# Patient Record
Sex: Male | Born: 1960
Health system: Southern US, Community
[De-identification: ages and names within clinical notes are randomized; demographics above are authoritative.]

## PROBLEM LIST (undated history)

## (undated) HISTORY — PX: FOOT SURGERY: SHX648

---

## 2003-08-23 ENCOUNTER — Inpatient Hospital Stay (HOSPITAL_COMMUNITY): Admission: RE | Admit: 2003-08-23 | Discharge: 2003-08-28 | Payer: Self-pay | Admitting: Psychiatry

## 2004-11-30 ENCOUNTER — Ambulatory Visit (HOSPITAL_COMMUNITY): Admission: RE | Admit: 2004-11-30 | Discharge: 2004-11-30 | Payer: Self-pay | Admitting: Family Medicine

## 2004-12-20 ENCOUNTER — Emergency Department (HOSPITAL_COMMUNITY): Admission: EM | Admit: 2004-12-20 | Discharge: 2004-12-20 | Payer: Self-pay | Admitting: Emergency Medicine

## 2004-12-24 ENCOUNTER — Ambulatory Visit (HOSPITAL_COMMUNITY): Admission: RE | Admit: 2004-12-24 | Discharge: 2004-12-24 | Payer: Self-pay | Admitting: Neurology

## 2004-12-27 ENCOUNTER — Encounter: Admission: RE | Admit: 2004-12-27 | Discharge: 2004-12-27 | Payer: Self-pay | Admitting: Neurology

## 2005-01-24 ENCOUNTER — Encounter: Admission: RE | Admit: 2005-01-24 | Discharge: 2005-01-24 | Payer: Self-pay | Admitting: Orthopedic Surgery

## 2005-07-17 ENCOUNTER — Emergency Department (HOSPITAL_COMMUNITY): Admission: EM | Admit: 2005-07-17 | Discharge: 2005-07-17 | Payer: Self-pay | Admitting: Emergency Medicine

## 2006-06-26 IMAGING — CR DG THORACIC SPINE 3V
3 series · 3 of 3 positions shown · non-contrast
Comparison: none

CLINICAL DATA: Pain in the mid back.
BONE SCAN:
The patient was injected with 23.9 mCi of Lechnetium-VVm MDP intravenously, and imaging of the thoracolumbar spine was performed.  On the early soft tissue phase, no abnormality is seen.  On delayed images, minimal activity is noted in the region of T5 and T6 vertebral bodies.  The remainder of activity throughout the thoracic and lumbar spine appear normal.  Plain films of the thoracic spine will be obtained.

[view not recorded (1 of 3)]
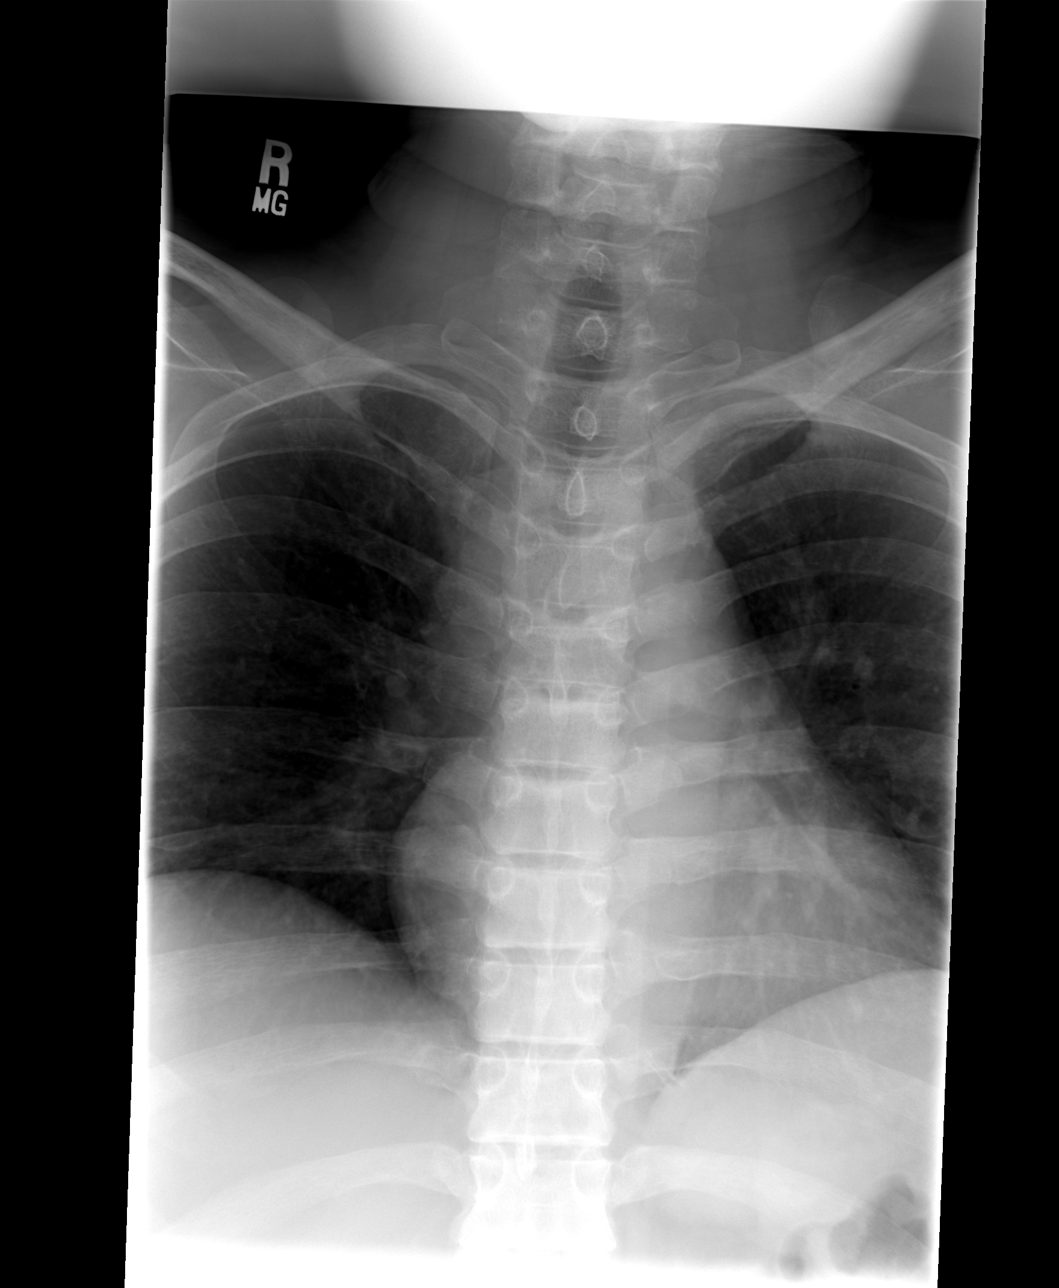

[view not recorded (2 of 3)]
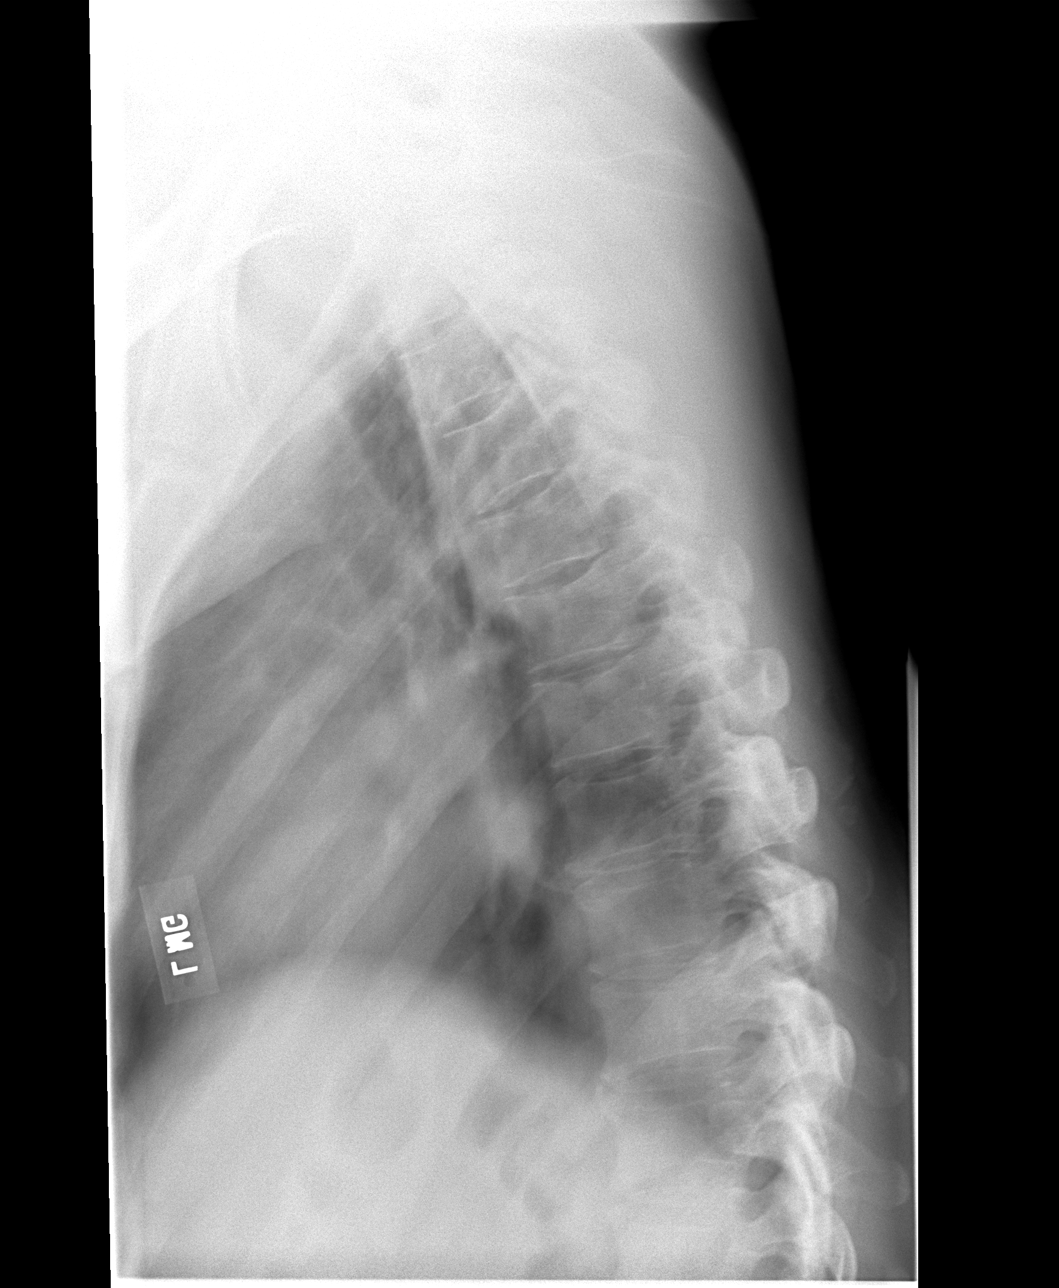

[view not recorded (3 of 3)]
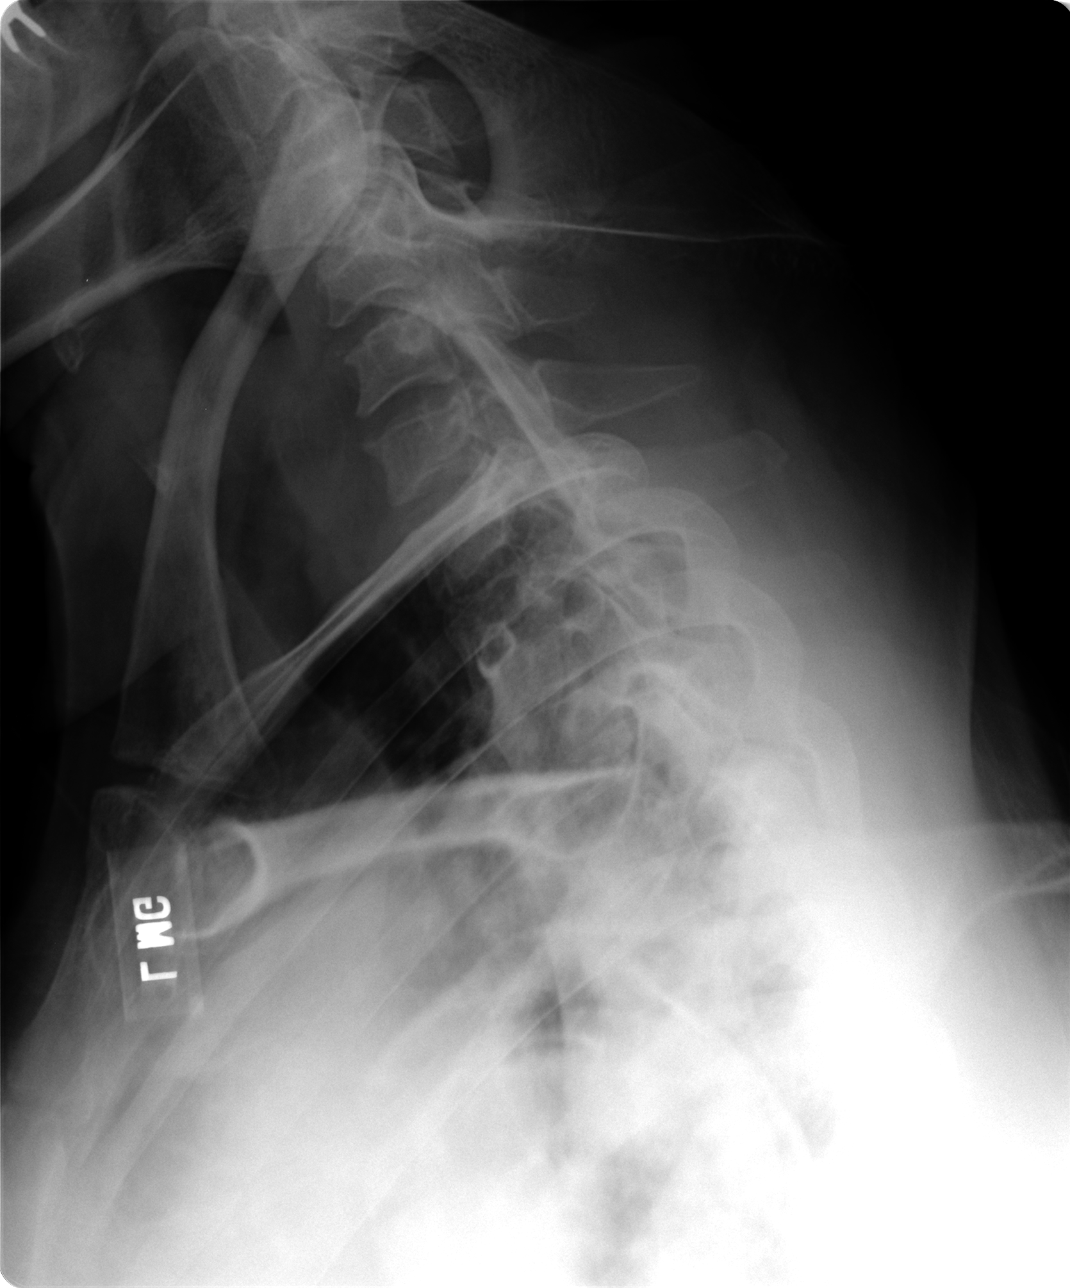

[3 of 3 positions shown; findings below may reference images not displayed]

IMPRESSION: Mildly increased activity at T5 and T6.  Plain films to be obtained.
THORACIC SPINE:
Three views of the thoracic spine Francisco Agostinho obtained.  No acute abnormality is seen in the region of T5 and T6.  The activity on bone scan, which is minimal, may be due to mild degenerative change.
IMPRESSION: No plain film abnormality in the region of T5 and T6.  The faint activity by bone scan at that site may be due to mild degenerative change.

## 2007-08-26 ENCOUNTER — Ambulatory Visit: Payer: Self-pay | Admitting: *Deleted

## 2007-08-26 ENCOUNTER — Inpatient Hospital Stay (HOSPITAL_COMMUNITY): Admission: RE | Admit: 2007-08-26 | Discharge: 2007-09-03 | Payer: Self-pay | Admitting: *Deleted

## 2007-08-26 ENCOUNTER — Emergency Department (HOSPITAL_COMMUNITY): Admission: EM | Admit: 2007-08-26 | Discharge: 2007-08-26 | Payer: Self-pay | Admitting: Emergency Medicine

## 2007-11-02 ENCOUNTER — Emergency Department (HOSPITAL_COMMUNITY): Admission: EM | Admit: 2007-11-02 | Discharge: 2007-11-02 | Payer: Self-pay | Admitting: Emergency Medicine

## 2008-08-02 ENCOUNTER — Ambulatory Visit: Payer: Self-pay | Admitting: Internal Medicine

## 2008-08-02 DIAGNOSIS — K089 Disorder of teeth and supporting structures, unspecified: Secondary | ICD-10-CM | POA: Insufficient documentation

## 2008-08-02 DIAGNOSIS — Z9189 Other specified personal risk factors, not elsewhere classified: Secondary | ICD-10-CM | POA: Insufficient documentation

## 2008-08-02 DIAGNOSIS — E785 Hyperlipidemia, unspecified: Secondary | ICD-10-CM

## 2008-08-02 DIAGNOSIS — L719 Rosacea, unspecified: Secondary | ICD-10-CM

## 2008-08-02 DIAGNOSIS — I10 Essential (primary) hypertension: Secondary | ICD-10-CM | POA: Insufficient documentation

## 2008-08-15 ENCOUNTER — Encounter (INDEPENDENT_AMBULATORY_CARE_PROVIDER_SITE_OTHER): Payer: Self-pay | Admitting: Internal Medicine

## 2008-08-16 ENCOUNTER — Ambulatory Visit: Payer: Self-pay | Admitting: Cardiology

## 2008-10-20 ENCOUNTER — Ambulatory Visit: Payer: Self-pay | Admitting: Cardiology

## 2010-12-18 NOTE — Assessment & Plan Note (Signed)
[facility]                            CARDIOLOGY OFFICE NOTE   Chad Knight                     MRN:          [id number]  DATE:08/16/2008                            DOB:          Nov 18, 1960    PRIMARY CARE PHYSICIAN:  Marcene Duos, MD, at [facility].   HISTORY OF PRESENT ILLNESS:  This is a 50 year old with a prior history  of polysubstance abuse as well as hyperlipidemia and hypertension who  presents to Cardiology Clinic for evaluation of chest pain.  The patient  has had a 68-month history of atypical chest pain.  He tells me that he  get sharp pains in his chest that tend to be mild-to-moderate in  character with emotional stress and also when laying down at night when  he goes to bed.  The chest pain can last up to 2 hours at a time.  As  mentioned, it does seem to be at times when he is more anxious or under  stress.  He never gets chest pain with exertion.  He is very active.  He  walks briskly for exercise.  He occasionally plays basketball, and he  has never had any chest pain or shortness of breath with these  activities.  He really has no exercise limitations, whatsoever.  He  tells me that the atypical chest pain is happening daily and has done so  for 6 months and this worries him, because he has a strong family  history of premature coronary artery disease.  He is a prior smoker.  He  quit smoking about a year ago.  He also quit using illicit drugs about a  year ago as well.  He carries a history of hyperlipidemia; however,  there are no recent lipids that have been done and he is not on  treatment for it.  He also gives a history of hypertension,  though his  blood pressure is 122/74 today; however, it was 140/90 in his primary  care physician's office on December 29.  The patient additionally  reports a history of daily palpitations.  He notices them more when he  is in bed at night.  This is concerning given his  strong family history  of apparent sudden cardiac death.   PAST MEDICAL HISTORY:  1. Polysubstance abuse.  The patient in the past has abused OxyContin,      alcohol, and cocaine.  He has not been using any substances at all      for the last year.  2. Hypertension.  3. Hyperlipidemia.  4. Prior smoking.  The patient smoked a pack a day until 1 year ago.      He has been off cigarettes for a year now.   SOCIAL HISTORY:  The patient is divorced.  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  He is not using any drugs or  alcohol for a year.  He has not smoked for a year.   FAMILY HISTORY:  The patient's father died from presumed sudden cardiac  death at the age of  24, his sister died from presumed sudden cardiac  death at the age of 10, and he had another sister who had percutaneous  coronary intervention at the age of 61 as well.   REVIEW OF SYSTEMS:  Negative, except as noted in the history of present  illness.   MEDICATIONS:  1. Toprol-XL 25 mg daily; however, the patient has not started this      medication.  2. Trazodone 100 mg daily.  3. Aspirin 81 mg daily.   EKG is reviewed today; shows normal sinus rhythm, it is a completely  normal EKG, rate is 58.  There were no recent labs to review.   PHYSICAL EXAMINATION:  VITAL SIGNS:  Blood pressure is 122/74 and heart  rate is 58 and regular.  GENERAL:  This is a well-developed male in no apparent distress.  NEUROLOGIC:  Alert and orient x3.  Normal affect.  LUNGS:  Clear to auscultation bilaterally with normal respiratory  effort.  CARDIOVASCULAR:  Heart regular S1 and S2.  No S3.  No S4.  There is no  murmur.  There is no peripheral edema.  There is no carotid bruit.  There are 2+ posterior tibial pulses bilaterally.  NECK:  There is no JVD.  There is no thyromegaly or thyroid nodule.  ABDOMEN:  Soft and nontender.  No hepatosplenomegaly.  Normal bowel  sounds.  HEENT:  Normal exam.   MUSCULOSKELETAL:  Normal exam.  SKIN:  Normal exam.  EXTREMITIES:  No clubbing or cyanosis.   ASSESSMENT AND PLAN:  This is a 50 year old with a history of prior  polysubstance abuse, borderline hypertension, and hyperlipidemia, who  presents to Cardiology Clinic for evaluation of atypical chest pain.  1. Chest pain.  The patient's chest pain is quite atypical.  It      happens only with emotional stress and when he lays down at night.      He is very active at baseline, plays strenuous basketball, and      walks briskly for exercise.  He has never had any chest pain or      shortness of breath with exertion at all.  Given his strong family      history and his risk factors of presumed hyperlipidemia,      hypertension, and prior smoking, I do think it is reasonable to do      a screening test for ischemia.  We will do an exercise treadmill      test and assess for any evidence for ischemia.  He should be taking      aspirin 81 mg a day.  2. Hypertension.  The patient's blood pressure is not elevated today.      It was 140/90 in his primary care physician's office.  He was      started on Toprol-XL by Dr. Delrae Alfred.  He has not picked up the      prescription yet though.  I did emphasize to him that he needs to      be completely abstinent from cocaine while he is on the Toprol-XL.      He states this should not be a problem, as he has been off for over      a year now.  3. Hyperlipidemia.  The patient will have his lipids done at      [facility] and we will obtain the results from them.  My goal for      him would be LDL below 100 given his  risk factors and his very      strong family history.  4. Palpitations.  The patient does report a history of daily      palpitations.  He does have a family      history of apparent sudden cardiac death.  We will go ahead and get      24-hour Holter monitor on him.     Marca Ancona, MD  Electronically Signed    DM/MedQ  DD: 08/16/2008   DT: 08/16/2008  Job #: [id number]   cc:   Marcene Duos, M.D.

## 2010-12-18 NOTE — H&P (Signed)
Chad Knight, COCUZZA NO.:  [id number]   MEDICAL RECORD NO.:  [id number]          PATIENT TYPE:  IPS   LOCATION:  0304                          FACILITY:  [facility]   PHYSICIAN:  Jasmine Pang, M.D. DATE OF BIRTH:  June 26, 1961   DATE OF ADMISSION:  08/26/2007  DATE OF DISCHARGE:                       PSYCHIATRIC ADMISSION ASSESSMENT   IDENTIFICATION:  This is a 50 year old divorced white male from  [place], [place].   HISTORY OF PRESENT ILLNESS:  The patient was here due to an overdose.  He told his primary care Tikia Skilton, he was having suicidal ideation, but  no plan.  He had no homicidal ideation.  No auditory or visual  hallucinations.  Stressors involved with mother dying in March 2008,  after chronic illness.  He also had relationship in last year.  He has  been unable to work since September 2006, and he feels he will be  homeless by months in.  He also uses alcohol, cocaine, and opiates.  He  states he uses 6-8 beers a night and 80 mg of OxyContin daily.  He has  episodically been abusing cocaine.   PAST PSYCHIATRIC HISTORY:  The patient was at [facility] 2 years ago for similar problems.  He states he has been at [facility]  for detox about 2 years ago.  He was at [facility] 3 years ago.  He states all his mood problems worsened in December  and January of each year.  He has seen Dr. Tomasa Rand in the past for  outpatient therapy at [facility] for his outpatient  medication management   PAST MEDICAL HISTORY:  The patient recently had hypertension, but it has  not been treated.  He has no other acute or chronic medical problems.  The patient's primary care physician is Dr. Jerelyn Scott .     Medications include Celexa 60 mg daily, Seroquel, and Klonopin 2 mg p.o.  t.i.d.   He has no known drug allergies.   SOCIAL HISTORY:  The patient currently lives alone in an apartment.  He  had been  living with his mother before she died.  He states this has  been a big stress for him.  The loss of his job has also been stressful.  He is afraid he is going to be homeless soon because he has run out of  income.  For a period of time, he was able to use some inheritance money  from his mother.   SUBSTANCE ABUSE HISTORY:  The patient is using 6-8 beers a day and 80 mg  of OxyContin a day.  He also uses cocaine occasionally.   FAMILY HISTORY:  He has three sisters and they are being treated with  antidepressants for depression and anxiety.  He currently sees Dr. Jerelyn Scott, his primary care physician, for his psychiatric treatment.   PHYSICAL EXAMINATION:  A complete physical exam was done in the [facility] ED.  There were no acute physical or medical problems.   ADMISSION LABORATORY:  The urine drug screen was positive for  cocaine,  benzodiazepines, and barbiturates.  The CBC with differential was  grossly within normal limits.  Alcohol level was less than 5.   MENTAL STATUS EXAM:  The patient was somewhat reserved, but cooperative  with fair eye contact.  Speech was soft and slow.  There was psychomotor  retardation.  He was alert.  Mood was depressed and anxious.  Affect  consistent with mood.  His thought processes were coherent.  Thought  content within normal limits.  Perceptions, normal.  Cognitive was  grossly within normal limits.   ADMISSION DIAGNOSES:  Axis I:  Mood disorder, not otherwise specified  and polysubstance abuse.  Axis II:  Deferred.  Axis III:  Mild hypertension.  Axis IV:  Severe (no job, deaths of mother and sister, his concerns  about being homeless soon, financial issues, and burden of psychiatric  illness).  Axis V:  GAF upon admission was 35.  GAF highest past year 65 to 70.   PLAN:  The patient will be on Librium detox protocol and the clonidine  detox protocol.  He will be started on Seroquel 25 mg p.o. q.4 h. p.r.n.  anxiety.  He will be  started on Cymbalta 60 mg p.o. daily.  Celexa will  be discontinued.  He will be involved in therapeutic groups and  activities.  He will be on the Red team.      Jasmine Pang, M.D.  Electronically Signed     BHS/MEDQ  D:  08/27/2007  T:  08/27/2007  Job:  [id number]

## 2010-12-21 NOTE — Discharge Summary (Signed)
NAME:  Chad Knight, Chad Knight NO.:  [id number]   MEDICAL RECORD NO.:  [id number]                   PATIENT TYPE:  IPS   LOCATION:  0502                                 FACILITY:  [facility]   PHYSICIAN:  Geoffery Lyons, M.D.                   DATE OF BIRTH:  1960/12/28   DATE OF ADMISSION:  08/23/2003  DATE OF DISCHARGE:  08/28/2003                                 DISCHARGE SUMMARY   CHIEF COMPLAINT AND PRESENT ILLNESS:  This was the first admission to [facility] for this 50 year old separated white male  voluntarily admitted.  He presented with a history of suicidal ideas.  He  did state that he tried to kill himself on Saturday prior to this admission  when he stated he overdosed on Xanax, Klonopin, and Valium, taking up to 180  mg of Xanax, 100 mg of Valium, 450 mg of Klonopin.  He had been buying his  medications off the Internet.  He checked himself into a hotel room with the  intention to kill himself.  He did not want anyone to find him at home.  He  _________very concerned that because he had not been going to work since  Thursday.  The wife called the police and found him in the hotel room.  The  patient stated that all he did was sleep off the medication.  He also had a  gun that was confiscated.  He also reported he had been using OxyContin for  the past year, taking up to 60 to 80 mg per day.  Two Bentyl daily for the  past couple of years, buying from the Internet.  He claimed that he was  tired of life in general, having some financial difficulties.   PAST PSYCHIATRIC HISTORY:  This was the first time at [facility].  No suicidal attempt, no history of prior detoxification.   SUBSTANCE ABUSE HISTORY:  He also had been drinking four to five beers  daily, abusing benzodiazepines, abusing opiates.   PAST MEDICAL HISTORY:  Noncontributory.   MEDICATIONS:  He had been on Lexapro inconsistently.   PHYSICAL  EXAMINATION:  Physical examination was performed, failed to show  any acute findings.   LABORATORY DATA:  Blood chemistries were within normal limits.  Liver  profile was within normal limits.  Thyroid profile was within normal limits.   MENTAL STATUS EXAM:  Mental status exam revealed an alert, cooperative male  but little eye contact, psychomotor retardation.  Mood: Depressed.  Affect:  Depressed, feeling tired.  Thought processes: Coherent; no evidence of  psychosis, does not appear to be distracted by internal stimuli.  Cognitive:  Cognition was well preserved.   ADMISSION DIAGNOSES:   AXIS I:  1. Major depression.  2. Benzodiazepine, alcohol, and opioid abuse, rule out dependence.   AXIS II:  No diagnosis.   AXIS III:  No diagnosis.   AXIS IV:  Moderate.   AXIS V:  Global assessment of functioning upon admission 25, highest global  assessment of functioning in the last year 60-65.   HOSPITAL COURSE:  He was admitted and started in intensive individual and  group psychotherapy.  He was detoxified with both Librium and clonidine.  He  was given trazodone for sleep.  He was maintained on Lexapro that was  increased up to 50 mg per day.  He minimized, he denied, he was basically  wanting to go home.  There were a lot of concerns in terms of losing his job  if he was not discharged and go back soon.  He was basically living with his  mother who had Alzheimer's, worried about the debts.  There was a family  session with his wife.  He was going to return home with his mother.  They  had been separated for five years.  He denied any suicidal or homicidal  ideation.  Apparently his wife was not aware of the patient's extensive drug  use.  He continued to settle down.  He was more open.  He was able to talk  in group and he was able to go to 12 step meetings.  He continued to  endorse, minimize the use of substances, felt that opiates made him feel  good, claimed he did not take  them because he was depressed, he just liked  the way they made him feel.  He also minimized the use of alcohol.  He could  not say why he was trying to kill himself.  He did endorse that he felt he  was failing as a father, as a Aleem Elza.  As the hospitalization progressed,  he did endorse that he was feeling better.  There was increased insight,  endorsed that what he did was a mistake, was able to work on his coping  skills, committed to abstinence.  He denied any suicidal or homicidal ideas,  was going to be following up with a counselor recommended by [facility].  He was  wanting to go home, then get back to work as soon as possible as he was  basically given a new position and he felt he was ready to do it.  Upon  discharge, he was in full contact with reality, no suicidal ideas, no  homicidal ideas, willing and motivated to pursue further outpatient  treatment.   DISCHARGE DIAGNOSES:   AXIS I:  1. Major depression.  2. Polysubstance abuse.   AXIS II:  No diagnosis.   AXIS III:  No diagnosis.   AXIS IV:  Moderate.   AXIS V:  Global assessment of functioning upon discharge 55.   DISCHARGE MEDICATIONS:  1. Lexapro 15 mg per day.  2. Trazodone 50 mg at bedtime for sleep.   FOLLOW UP:  He was to follow up with Glendell Docker as well as  psychiatrist as approved by the insurance plan.                                               Geoffery Lyons, M.D.   IL/MEDQ  D:  09/16/2003  T:  09/17/2003  Job:  [id number]

## 2010-12-21 NOTE — Procedures (Signed)
DESCRIPTION:  A posterior dominant rhythm of 9 hertz emits synchronously and  symmetrically over both posterior hemispheres and attenuates promptly with  eye opening. Photic stimulation leads to photic entrainment at 7, 9, 11  hertz but seems to no longer influence the background rhythm in frequencies  above 13 hertz. There is no motion artifact, very little motor artifact, and  no beta fast activity in excess seen. The patient reached drowsiness  indicated by vertex sharp waves and slowing into the theta range  frequencies. There were some temporal high amplitude waves noticed at 12.  Those were again symmetrical and synchronous, and I believe they are related  to normal drowsiness. There was no clinical evidence of a seizure seen  during this EEG recording. The patient is compliant, alert, and oriented x3.   CONCLUSION:  This is a normal EEG for the patient's age and conscious state  without evidence of epileptiform activity.      QV:ZDGL  D:  12/24/2004 12:48:54  T:  12/24/2004 13:23:04  Job #:  [id number]

## 2010-12-21 NOTE — H&P (Signed)
NAME:  Chad Knight, Chad Knight NO.:  [id number]   MEDICAL RECORD NO.:  [id number]                   PATIENT TYPE:  IPS   LOCATION:  0502                                 FACILITY:  BH   PHYSICIAN:  Jeanice Lim, M.D.              DATE OF BIRTH:  12-01-1960   DATE OF ADMISSION:  08/23/2003  DATE OF DISCHARGE:                                HISTORY & PHYSICAL   IDENTIFYING INFORMATION:  A 50 year old separated white male, voluntarily  admitted on August 23, 2003.   HISTORY OF PRESENT ILLNESS:  The patient presents with a history of suicidal  ideation.  The patient did state he tried to kill himself on Saturday prior  to this admission, where he states he overdosed on Xanax, Klonopin, and  Valium, taking up to 180 mg of Xanax, 100 mg of Valium and 450 mg of  Klonopin.  The patient reports he has been buying his medications off the  internet.  He states he checked himself into a hotel room with the intention  to kill himself because he did not want anyone to find him at home.  The  patient states that people had gotten very concerned because he had not been  going to work since Thursday of last week.  His wife had called the police  and found him in the hotel room.  The patient states that all he did was  sleep off the medication.  He states he also had a gun in the hotel room to  shoot himself, which was confiscated.  The patient also reports he has been  using OxyContin for the past year, taking up to 60-80 mg per day.  His last  use of the medication was on Friday prior to this admission.  He has been  taking approximately 2 Bentyl daily for the past couple of years, again  buying them off the internet.  The patient denies any specific stressors.  He states he is just tired of life in general.  He states he is having  some financial difficulties and feels he cannot support his children.   PAST PSYCHIATRIC HISTORY:  First admission to [facility], no  history of a suicide attempt.  He has no other psychiatric admissions, no  history of being detoxed.   SOCIAL HISTORY:  He is a 50 year old separated white male, separated for 5  years.  He is living with his mother.  He has 2 children ages 16 and 69 for  which he has joint custody.  He works at [employer]	[place], reports he  recently got a promotion.  No legal charges, no psychiatric history in the  family.   ALCOHOL DRUG HISTORY:  The patient smokes on occasion.  He states he is also  drinking 4-5 beers daily, denies any other drug use other than as stated in  the history of present illness.   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  Medical problems are none.   MEDICATIONS:  Has been on Lexapro, taking it somewhat inconsistently, is  unsure of the dosage.  Has been taking it for about a year.   DRUG ALLERGIES:  No known allergies.   PHYSICAL EXAMINATION:  The patient was assessed at [facility].  The patient is a well-nourished, well-developed middle-aged  male.   MENTAL STATUS EXAM:  He is an alert middle-aged male, little eye contact.  Psychomotor retardation.  Speech is clear.  Mood:  The patient says he feels  tired.  He appears profoundly depressed.  Thought processes are coherent.  There is no evidence of psychosis, does not appear to be distracted by  extraneous stimuli.  Contact function intact.  No evidence of psychosis.  Memory is fair, judgment and insight poor.   ADMISSION DIAGNOSES:   AXIS I:  1. Major depressive disorder.  2. Opiate abuse rule out dependence.  3. Benzodiazepines abuse rule out dependence.  4. Alcohol abuse.   AXIS II:  Deferred.   AXIS III:  None.   AXIS IV:  Problems with economics, occupation, other psychosocial problems.   AXIS V:  Current is 25, this past year is 60-65.   PLAN:  Voluntary admission for depression, suicide attempt, continued  suicidal ideation and  polysubstance abuse rule out dependence.  Contract for  safety, check every 15 minutes.  Will stabilize and detox with Clonidine  protocol and low-dose Librium.  Will initiate an antidepressant to decrease  depressive symptoms while the patient is hospitalized.  The patient is to  increase coping skills, to follow up at mental health, be medication  compliant and to remain alcohol and drug free.   TENTATIVE LENGTH OF CARE:  4-6 days.     Landry Corporal, N.P.                       Jeanice Lim, M.D.    JO/MEDQ  D:  08/24/2003  T:  08/24/2003  Job:  [id number]

## 2010-12-21 NOTE — Discharge Summary (Signed)
Chad Knight, SAGAN NO.:  [id number]   MEDICAL RECORD NO.:  [id number]          PATIENT TYPE:  IPS   LOCATION:  0304                          FACILITY:  [facility]   PHYSICIAN:  Jasmine Pang, M.D. DATE OF BIRTH:  1961-07-21   DATE OF ADMISSION:  08/26/2007  DATE OF DISCHARGE:  09/03/2007                               DISCHARGE SUMMARY   IDENTIFICATION:  This is a 50 year old divorced white male from  [place], [place].   HISTORY OF PRESENT ILLNESS:  The patient was here due to an overdose.  He told his primary care Tera Pellicane he was having suicidal ideation, but  no plan.  He had no homicidal ideation.  No known auditory or visual  hallucinations.  Stressors revolved around his mother dying in March  2008 after chronic illness.  He also has had a relationship fail in the  past year.  He has been unable to work since September 2006, and feels  he would be homeless by months end; he also uses alcohol, cocaine, and  opiates.  He states he uses 6-8 beers per night and 80 mg of OxyContin  daily.  He has episodically been abusing cocaine.   PAST PSYCHIATRIC HISTORY:  The patient was at [facility] 2 years ago for similar problems.  He states he has been at [facility]  for detox about 2 years ago.  He was in [facility] 3 years ago.  He states all his mood problems worsened in December  and January of each year.  He has seen Dr. Tomasa Rand in the past for  outpatient medical management.   PAST MEDICAL HISTORY:  The patient recently had hypertension, but it has  not been treated.  He has no other acute or chronic medical problems.   MEDICATIONS:  Include Celexa 60 mg daily, Seroquel, and Klonopin 2 mg  p.o. t.i.d.   DRUG ALLERGIES:  No known drug allergies.   FAMILY HISTORY:  The patient has 3 sisters and they are being treated  with antidepressants for depression and anxiety.  He has been seeing his  primary care  physician, Dr. Merla Riches, for his psychiatric treatment.   SUBSTANCE ABUSE HISTORY:  The patient is using approximately 6-8 beers a  day and 80 mg of OxyContin a day.  He also uses cocaine occasionally.   PHYSICAL EXAMINATION:  A complete physical exam was done in the ED.  There were no acute physical or medical problems.   ADMISSION LABORATORIES:  Urine drug screen was positive for cocaine,  benzodiazepines, and barbiturates.  CBC with differential was grossly  within normal limits.  Alcohol level was less than 5.   HOSPITAL COURSE:  Upon admission, the patient was started on Librium  detox protocol.  He was also started on trazodone 50 mg q.h.s. p.r.n.  insomnia and Symmetrel 100 mg p.o. b.i.d. p.r.n. cocaine craving.  He  was also started on clonidine detox protocol due to his OxyContin use.  He was started on Cymbalta 60 mg daily and Seroquel 25 mg q.4 h. p.r.n.  anxiety.  The patient tolerated these medications well with no  significant side effects.  He was initially reserved with a constricted  affect in sessions with me.  He participated appropriately in unit  therapeutic groups and activities.  He stated he had been depressed for  many years.  Recently, his depression has worsened.  He discussed the  loss of his mother and sister.  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  He feels like he is going to be  homeless soon.  He stated that his mood problems worsened in the month  of December and January.  The patient continued to be depressed and  anxious with positive suicidal ideation, though he could contract for  safety here.  He had not talked with his sisters.  He felt they were  disappointed in his behavior.  He does not feel they will keep him from  becoming homeless.  The patient found out that his family was moving his  things out of his house, he became even more suicidal, I wish I had not  woken up.  He worried about where he is going to go  live when he leaves  here.  He was still resistant to long-term rehab treatment, I am not a  fan of the 12 steps.  As hospitalization progressed, his mood became  slightly less depressed and anxious.  He admitted to being scared about  where he was going to go after he left the hospital.  He stated I am  now officially homeless after he found out all of his belongings had  been placed in storage.  On September 01, 2007, the patient was notably  feeling better.  His sleep was good.  His appetite was improving.  He  began to call [facility], he had one scheduled for September 03, 2007,  and wanted to go to this meeting.  He felt ready to go home.  He also  stated he would apply for a 90-day program ([facility]).  His resistance to 12-step program seemed to wane.  On  September 03, 2007, mental status had improved markedly from admission  status, the patient was less depressed and less anxious.  Affect was  consistent with mood.  There was no suicidal or homicidal ideation.  No  thoughts of self-injurious behavior.  No auditory or visual  hallucinations.  No paranoia or delusions.  Thoughts were logical and  goal-directed.  Thought content, no predominant theme.  Cognitive was  grossly back to baseline.  The patient felt ready to go home.  He was  going to go to an [address] and had some anxiety  about this transition.  He discussed feeling guilty for his actions.  It  was felt he was safe to be discharged.   DISCHARGE DIAGNOSES:  Axis I:  Mood disorder, not otherwise specified.  Polysubstance dependence.  Axis II:  None.  Axis III:  Mild hypertension.  Axis IV:  Severe (no job, death of his mother and sister, concerns about  being homeless soon, financial issues, and burden of psychiatric  illness).  Axis V:  Global assessment of functioning upon discharge was 50.  GAF  upon admission was 35.  GAF highest past year was 65-70.      Jasmine Pang, M.D.  Electronically Signed     [facility]/MEDQ  D:  09/19/2007  T:  09/19/2007  Job:  [id number]

## 2011-04-25 LAB — DIFFERENTIAL
Basophils Relative: 1
Eosinophils Absolute: 0.1
Lymphs Abs: 1.6
Neutro Abs: 7

## 2011-04-25 LAB — COMPREHENSIVE METABOLIC PANEL
ALT: 20
Albumin: 4.3
Alkaline Phosphatase: 75
Chloride: 104
GFR calc Af Amer: >60
Sodium: 140
Total Bilirubin: 1.2

## 2011-04-25 LAB — CBC
Hemoglobin: 15.4
MCHC: 35.1
RBC: 4.62
RDW: 13.4
WBC: 9.5

## 2011-04-25 LAB — RAPID URINE DRUG SCREEN, HOSP PERFORMED
Amphetamines: NOT DETECTED
Barbiturates: POSITIVE — AB
Benzodiazepines: POSITIVE — AB
Cocaine: POSITIVE — AB
Opiates: NOT DETECTED

## 2011-04-29 LAB — URINALYSIS, ROUTINE W REFLEX MICROSCOPIC
Bilirubin Urine: NEGATIVE
Ketones, ur: NEGATIVE
Nitrite: NEGATIVE
Protein, ur: NEGATIVE

## 2015-10-06 ENCOUNTER — Emergency Department (HOSPITAL_COMMUNITY)
Admission: EM | Admit: 2015-10-06 | Discharge: 2015-10-07 | Disposition: A | Discharge status: Other Acute Inpatient Hospital | Payer: Self-pay | Attending: Emergency Medicine | Admitting: Emergency Medicine

## 2015-10-06 ENCOUNTER — Encounter (HOSPITAL_COMMUNITY): Payer: Self-pay

## 2015-10-06 DIAGNOSIS — T1491XA Suicide attempt, initial encounter: Secondary | ICD-10-CM

## 2015-10-06 DIAGNOSIS — F1310 Sedative, hypnotic or anxiolytic abuse, uncomplicated: Secondary | ICD-10-CM | POA: Insufficient documentation

## 2015-10-06 DIAGNOSIS — Z87891 Personal history of nicotine dependence: Secondary | ICD-10-CM | POA: Insufficient documentation

## 2015-10-06 DIAGNOSIS — Y998 Other external cause status: Secondary | ICD-10-CM | POA: Insufficient documentation

## 2015-10-06 DIAGNOSIS — Y9289 Other specified places as the place of occurrence of the external cause: Secondary | ICD-10-CM | POA: Insufficient documentation

## 2015-10-06 DIAGNOSIS — X838XXA Intentional self-harm by other specified means, initial encounter: Secondary | ICD-10-CM | POA: Insufficient documentation

## 2015-10-06 DIAGNOSIS — T1491 Suicide attempt: Secondary | ICD-10-CM | POA: Insufficient documentation

## 2015-10-06 DIAGNOSIS — Y9389 Activity, other specified: Secondary | ICD-10-CM | POA: Insufficient documentation

## 2015-10-06 LAB — COMPREHENSIVE METABOLIC PANEL
ALBUMIN: 4.1 g/dL (ref 3.5–5.0)
ALT: 35 U/L (ref 17–63)
AST: 33 U/L (ref 15–41)
Alkaline Phosphatase: 62 U/L (ref 38–126)
Anion gap: 8 (ref 5–15)
BILIRUBIN TOTAL: 0.4 mg/dL (ref 0.3–1.2)
BUN: 14 mg/dL (ref 6–20)
CHLORIDE: 107 mmol/L (ref 101–111)
CO2: 28 mmol/L (ref 22–32)
CREATININE: 0.92 mg/dL (ref 0.61–1.24)
Calcium: 9.2 mg/dL (ref 8.9–10.3)
GFR calc Af Amer: >60 mL/min (ref >60)
GLUCOSE: 97 mg/dL (ref 65–99)
Potassium: 3.6 mmol/L (ref 3.5–5.1)
Sodium: 143 mmol/L (ref 135–145)
Total Protein: 6.5 g/dL (ref 6.5–8.1)

## 2015-10-06 LAB — RAPID URINE DRUG SCREEN, HOSP PERFORMED
AMPHETAMINES: NOT DETECTED
Barbiturates: NOT DETECTED
Benzodiazepines: POSITIVE — AB
Cocaine: NOT DETECTED
Opiates: NOT DETECTED
TETRAHYDROCANNABINOL: NOT DETECTED

## 2015-10-06 LAB — CBC
HEMATOCRIT: 44.4 % (ref 39.0–52.0)
Hemoglobin: 15.1 g/dL (ref 13.0–17.0)
MCH: 34.2 pg — AB (ref 26.0–34.0)
MCHC: 34 g/dL (ref 30.0–36.0)
MCV: 100.5 fL — AB (ref 78.0–100.0)
PLATELETS: 210 10*3/uL (ref 150–400)
RBC: 4.42 MIL/uL (ref 4.22–5.81)
RDW: 13.3 % (ref 11.5–15.5)
WBC: 6.1 10*3/uL (ref 4.0–10.5)

## 2015-10-06 LAB — ACETAMINOPHEN LEVEL: Acetaminophen (Tylenol), Serum: <10 ug/mL — ABNORMAL LOW (ref 10–30)

## 2015-10-06 LAB — ETHANOL: ALCOHOL ETHYL (B): 6 mg/dL — AB (ref <5)

## 2015-10-06 LAB — SALICYLATE LEVEL: Salicylate Lvl: <4 mg/dL (ref 2.8–30.0)

## 2015-10-06 MED ORDER — DIPHENHYDRAMINE HCL 25 MG PO CAPS: 50.0000 mg | ORAL_CAPSULE | Freq: Every evening | ORAL | Status: DC | PRN

## 2015-10-06 MED ORDER — CLONAZEPAM 0.5 MG PO TABS
0.5000 mg | ORAL_TABLET | Freq: Two times a day (BID) | ORAL | Status: DC | PRN
  Administered 2015-10-06: 0.5 mg via ORAL
  Filled 2015-10-06: qty 1

## 2015-10-06 NOTE — ED Notes (Signed)
Pt. Noted in room. No complaints or concerns voiced. No distress or abnormal behavior noted. Will continue to monitor with security cameras. Q 15 minute rounds continue. 

## 2015-10-06 NOTE — ED Notes (Signed)
Patient has two bags of belongings in locker 26. 

## 2015-10-06 NOTE — ED Notes (Signed)
Pt shared that he has been living with a man who is obsessive/compulsive, and the arrangement is not working. Pt works for a [employer] company, does not make much money, and does not really know what he is going to do going forward. He has a 54 year old daughter, and he things that she resents the divorce between himself and her mother that happened 12 years ago. He planned to kill himself while [place] was in [place] so that she would not find his body, and he figured that she would be attending his funeral right now. He did not believe that his death would hurt his daughter, and did not seem concerned about how it might hurt her. Pt said that in the past Trazodone helped him some, but he has "tried everything" and nothing really works.

## 2015-10-06 NOTE — ED Notes (Signed)
Pt tried to hang himself today, but could not tolerate choking. Even so, he continues to want to die, and wishes that euthanasia were legal. His affect is flat and his mood is depressed. He is in bed under the covers and makes no requests or demands. He said thathe has been depressed for many years and is tired of it.

## 2015-10-06 NOTE — ED Notes (Signed)
Pt. Noted in room. No distress or abnormal behavior noted. Will continue to monitor with security cameras. Q 15 minute rounds continue. 

## 2015-10-06 NOTE — ED Notes (Signed)
Pt. C/o anxiety. 

## 2015-10-06 NOTE — ED Notes (Signed)
Pt. Noted sleeping in room with respirations regular and unlabored. Will continue to monitor with security cameras. Q 15 minute rounds continue.

## 2015-10-06 NOTE — BH Assessment (Signed)
Assessment Note  Chad Knight is an 55 y.o. male. Patient walked into the ED because of reported suicide attempt this morning.  Patient reports attempting to hang self this morning with a nylon climbing rope.  Patient reports 3 previous attempts and last 10 years ago.  Patient reports recent divorce and financial problems.  Patient reports daily use of alcohol, opioids, and benzo.    This Clinical research associate consulted with May, NP it is recommended to refer for inpatient hospitalization.    Diagnosis: Major Depressive Disorder, single episode  Past Medical History: History reviewed. No pertinent past medical history.  Past Surgical History  Procedure Laterality Date  . Foot surgery      Family History: No family history on file.  Social History:  reports that he has quit smoking. He does not have any smokeless tobacco history on file. He reports that he drinks alcohol. He reports that he does not use illicit drugs.  Additional Social History:  Alcohol / Drug Use Pain Medications: see chart Prescriptions: see chart  Over the Counter: see chart History of alcohol / drug use?: Yes Longest period of sobriety (when/how long): 1 year Negative Consequences of Use: Financial, Personal relationships, Work / School Withdrawal Symptoms: Sweats, Diarrhea, Fever / Chills, Irritability, Weakness, Nausea / Vomiting, Cramps Substance #1 Name of Substance 1: Alcohol 1 - Age of First Use: 16 1 - Amount (size/oz): 10 beers 1 - Frequency: daily 1 - Duration: ongoing Substance #2 Name of Substance 2: Opiods 2 - Age of First Use: 40s 2 - Amount (size/oz): varies 2 - Frequency: daily 2 - Duration: ongoing 2 - Last Use / Amount: Monday Substance #3 Name of Substance 3: Benzo 3 - Age of First Use: 63s 3 - Amount (size/oz): 8 or more 3 - Frequency: daily 3 - Duration: ongoing 3 - Last Use / Amount: today  CIWA: CIWA-Ar BP: 139/91 mmHg Pulse Rate: 86 Nausea and Vomiting: no nausea and no  vomiting Tactile Disturbances: none Tremor: no tremor Auditory Disturbances: not present Paroxysmal Sweats: no sweat visible Visual Disturbances: not present Anxiety: no anxiety, at ease Headache, Fullness in Head: none present Agitation: normal activity Orientation and Clouding of Sensorium: oriented and can do serial additions CIWA-Ar Total: 0 COWS: Clinical Opiate Withdrawal Scale (COWS) Resting Pulse Rate: Pulse Rate 80 or below Sweating: No report of chills or flushing Restlessness: Able to sit still Pupil Size: Pupils pinned or normal size for room light Bone or Joint Aches: Not present Runny Nose or Tearing: Not present GI Upset: No GI symptoms Tremor: No tremor Yawning: No yawning Anxiety or Irritability: None Gooseflesh Skin: Skin is smooth COWS Total Score: 0  Allergies:  Allergies  Allergen Reactions  . Darvon [Propoxyphene] Hives and Itching    Home Medications:  (Not in a hospital admission)  OB/GYN Status:  No LMP for male patient.  General Assessment Data [facility] of Assessment: [facility] TTS Assessment: In system Is this a Tele or Face-to-Face Assessment?: Face-to-Face Is this an Initial Assessment or a Re-assessment for this encounter?: Initial Assessment Marital status: Divorced Is patient pregnant?: No Pregnancy Status: No Living Arrangements: Alone Can pt return to current living arrangement?: Yes Is patient capable of signing voluntary admission?: No Referral Source: Self/Family/Friend Insurance type: none  Medical Screening Exam [facility] Walk-[facility] ONLY) Medical Exam completed: Yes  Crisis Care Plan Living Arrangements: Alone Name of Psychiatrist: none Name of Therapist: none  Education Status Is patient currently in school?: No Highest grade of school patient  has completed: college  Risk to self with the past 6 months Suicidal Ideation: Yes-Currently Present Has patient been a risk to self within the past 6 months prior to admission? :  Yes Suicidal Intent: Yes-Currently Present Has patient had any suicidal intent within the past 6 months prior to admission? : Yes Is patient at risk for suicide?: Yes Suicidal Plan?: Yes-Currently Present Has patient had any suicidal plan within the past 6 months prior to admission? : Yes Specify Current Suicidal Plan: hanging Access to Means: Yes Specify Access to Suicidal Means: nylon climbing rope What has been your use of drugs/alcohol within the last 12 months?: alcohol, opioids, benzo Previous Attempts/Gestures: Yes How many times?: 3 Triggers for Past Attempts: Family contact, Spouse contact Intentional Self Injurious Behavior: None Family Suicide History: No Recent stressful life event(s): Conflict (Comment), Divorce, Loss (Comment), Financial Problems, Job Loss Persecutory voices/beliefs?: No Depression: Yes Depression Symptoms: Despondent, Isolating, Fatigue, Guilt, Loss of interest in usual pleasures, Feeling worthless/self pity, Feeling angry/irritable Substance abuse history and/or treatment for substance abuse?: Yes  Risk to Others within the past 6 months Homicidal Ideation: No-Not Currently/Within Last 6 Months Does patient have any lifetime risk of violence toward others beyond the six months prior to admission? : No Thoughts of Harm to Others: No-Not Currently Present/Within Last 6 Months Current Homicidal Intent: No-Not Currently/Within Last 6 Months Current Homicidal Plan: No-Not Currently/Within Last 6 Months Access to Homicidal Means: No History of harm to others?: No Assessment of Violence: None Noted Does patient have access to weapons?: No Criminal Charges Pending?: No Does patient have a court date: No Is patient on probation?: No  Psychosis Hallucinations: None noted Delusions: None noted  Mental Status Report Appearance/Hygiene: In scrubs Eye Contact: Fair Motor Activity: Freedom of movement Speech: Logical/coherent Level of Consciousness:  Alert Mood: Depressed Affect: Depressed Anxiety Level: None Thought Processes: Relevant, Coherent Judgement: Unimpaired Orientation: Person, Place, Time, Situation Obsessive Compulsive Thoughts/Behaviors: None  Cognitive Functioning Concentration: Fair Memory: Recent Intact, Remote Intact IQ: Average Insight: Fair Impulse Control: Fair Appetite: Fair Sleep: No Change Vegetative Symptoms: None  ADLScreening [facility] Assessment Services) Patient's cognitive ability adequate to safely complete daily activities?: Yes Patient able to express need for assistance with ADLs?: Yes Independently performs ADLs?: Yes (appropriate for developmental age)  Prior Inpatient Therapy Prior Inpatient Therapy: Yes Prior Therapy Dates: unknown  Prior Outpatient Therapy Prior Outpatient Therapy: No Does patient have an ACCT team?: No Does patient have Intensive In-House Services?  : No Does patient have [facility] services? : No Does patient have P4CC services?: No  ADL Screening (condition at time of admission) Patient's cognitive ability adequate to safely complete daily activities?: Yes Patient able to express need for assistance with ADLs?: Yes Independently performs ADLs?: Yes (appropriate for developmental age)       Abuse/Neglect Assessment (Assessment to be complete while patient is alone) Physical Abuse: Denies Verbal Abuse: Denies Sexual Abuse: Denies Exploitation of patient/patient's resources: Denies Self-Neglect: Denies Values / Beliefs Cultural Requests During Hospitalization: None Spiritual Requests During Hospitalization: None Consults Spiritual Care Consult Needed: No Social Work Consult Needed: No Merchant navy officerAdvance Directives (For Healthcare) Does patient have an advance directive?: No    Additional Information 1:1 In Past 12 Months?: No CIRT Risk: No Elopement Risk: No Does patient have medical clearance?: Yes     Disposition:  Disposition Initial Assessment Completed  for this Encounter: Yes Disposition of Patient: Inpatient treatment program Type of inpatient treatment program: Adult  On Site Evaluation by:  Reviewed with Physician:    Maryelizabeth Rowan A 10/06/2015 5:46 PM

## 2015-10-06 NOTE — ED Provider Notes (Signed)
CSN: [id number]     Arrival date & time 10/06/15  1345 History   First MD Initiated Contact with Patient 10/06/15 1533     Chief Complaint  Patient presents with  . Suicide Attempt     (Consider location/radiation/quality/duration/timing/severity/associated sxs/prior Treatment) HPI Comments: Patient presents to the emergency department with chief complaint of suicide attempt. He states that he tried to hang himself this morning. States that he wrapped towel around his throat, and tried to hang himself from a door frame. He states that he is tired of his lifestyle and tired of living. He states that after gasping for breath and vomiting, he gave up, and came to the emergency department. He denies any homicidal ideation. There are no modifying factors.  The history is provided by the patient. No language interpreter was used.    History reviewed. No pertinent past medical history. Past Surgical History  Procedure Laterality Date  . Foot surgery     No family history on file. Social History  Substance Use Topics  . Smoking status: Former Games developermoker  . Smokeless tobacco: None  . Alcohol Use: Yes     Comment: daily     Review of Systems  All other systems reviewed and are negative.     Allergies  Darvon  Home Medications   Prior to Admission medications   Medication Sig Start Date End Date Taking? Authorizing Provider  ibuprofen (ADVIL,MOTRIN) 200 MG tablet Take 400 mg by mouth every 6 (six) hours as needed for headache, mild pain or moderate pain.   Yes Historical Provider, MD  loperamide (IMODIUM A-D) 2 MG capsule Take 2 mg by mouth as needed for diarrhea or loose stools.   Yes Historical Provider, MD   BP 139/91 mmHg  Pulse 86  Temp(Src) 97.9 F (36.6 C) (Oral)  Resp 18  SpO2 97% Physical Exam  Constitutional: He is oriented to person, place, and time. He appears well-developed and well-nourished.  HENT:  Head: Normocephalic and atraumatic.  Airway is patent, normal  phonation, no stridor  Eyes: Conjunctivae and EOM are normal. Pupils are equal, round, and reactive to light. Right eye exhibits no discharge. Left eye exhibits no discharge. No scleral icterus.  Neck: Normal range of motion. Neck supple. No JVD present.  No contusions, no tenderness to palpation, no bony abnormality or deformity, no obvious injury  Cardiovascular: Normal rate, regular rhythm and normal heart sounds.  Exam reveals no gallop and no friction rub.   No murmur heard. Pulmonary/Chest: Effort normal and breath sounds normal. No respiratory distress. He has no wheezes. He has no rales. He exhibits no tenderness.  Abdominal: Soft. He exhibits no distension and no mass. There is no tenderness. There is no rebound and no guarding.  Musculoskeletal: Normal range of motion. He exhibits no edema or tenderness.  Neurological: He is alert and oriented to person, place, and time.  Skin: Skin is warm and dry.  Psychiatric: He has a normal mood and affect. His behavior is normal. Judgment and thought content normal.  Nursing note and vitals reviewed.   ED Course  Procedures (including critical care time) Results for orders placed or performed during the hospital encounter of 10/06/15  Comprehensive metabolic panel  Result Value Ref Range   Sodium 143 135 - 145 mmol/L   Potassium 3.6 3.5 - 5.1 mmol/L   Chloride 107 101 - 111 mmol/L   CO2 28 22 - 32 mmol/L   Glucose, Bld 97 65 - 99 mg/dL  BUN 14 6 - 20 mg/dL   Creatinine, Ser 6.21 0.61 - 1.24 mg/dL   Calcium 9.2 8.9 - 30.8 mg/dL   Total Protein 6.5 6.5 - 8.1 g/dL   Albumin 4.1 3.5 - 5.0 g/dL   AST 33 15 - 41 U/L   ALT 35 17 - 63 U/L   Alkaline Phosphatase 62 38 - 126 U/L   Total Bilirubin 0.4 0.3 - 1.2 mg/dL   GFR calc non Af Amer >60 >60 mL/min   GFR calc Af Amer >60 >60 mL/min   Anion gap 8 5 - 15  Ethanol (ETOH)  Result Value Ref Range   Alcohol, Ethyl (B) 6 (H) <5 mg/dL  Salicylate level  Result Value Ref Range    Salicylate Lvl <4.0 2.8 - 30.0 mg/dL  Acetaminophen level  Result Value Ref Range   Acetaminophen (Tylenol), Serum <10 (L) 10 - 30 ug/mL  CBC  Result Value Ref Range   WBC 6.1 4.0 - 10.5 K/uL   RBC 4.42 4.22 - 5.81 MIL/uL   Hemoglobin 15.1 13.0 - 17.0 g/dL   HCT 65.7 84.6 - 96.2 %   MCV 100.5 (H) 78.0 - 100.0 fL   MCH 34.2 (H) 26.0 - 34.0 pg   MCHC 34.0 30.0 - 36.0 g/dL   RDW 95.2 84.1 - 32.4 %   Platelets 210 150 - 400 K/uL  Urine rapid drug screen (hosp performed) (Not at [facility])  Result Value Ref Range   Opiates NONE DETECTED NONE DETECTED   Cocaine NONE DETECTED NONE DETECTED   Benzodiazepines POSITIVE (A) NONE DETECTED   Amphetamines NONE DETECTED NONE DETECTED   Tetrahydrocannabinol NONE DETECTED NONE DETECTED   Barbiturates NONE DETECTED NONE DETECTED   No results found.  I have personally reviewed and evaluated these images and lab results as part of my medical decision-making.   EKG Interpretation   Date/Time:  Friday October 06 2015 14:42:24 EST Ventricular Rate:  82 PR Interval:  144 QRS Duration: 91 QT Interval:  382 QTC Calculation: 446 R Axis:   20 Text Interpretation:  Sinus rhythm RSR' in V1 or V2, probably normal  variant Abnormal inferior Q waves No previous ECGs available Confirmed by  YAO  MD, DAVID ([id number]) on 10/06/2015 3:44:58 PM      MDM   Final diagnoses:  Suicide attempt [facility])    Patient who tried to hang himself.  No sign of neck trauma.  No difficulty breathing.  Normal phonation.  Discussed with Dr. Silverio Lay, who agrees that in the absence of injury, no imaging of neck.  Medically clear.  TTS recommends inpatient treatment.      Roxy Horseman, PA-C 10/06/15 1822  Richardean Canal, MD 10/06/15 [phone]

## 2015-10-06 NOTE — BH Assessment (Signed)
[facility]ad [facility], confirmed room 407-1 is available. Pt was accepted by Chad ManeMay Agustin, NP for Chad Knight. [name]. Notified [name]. Floyd and Marita SnellenGary Leduc, RN of acceptance.  Harlin RainFord Ellis Patsy BaltimoreWarrick Jr, LPC, [facility], [facility] Triage Specialist [phone]

## 2015-10-06 NOTE — ED Notes (Signed)
Pt. Noted sleeping in room. No complaints or concerns voiced. No distress or abnormal behavior noted. Will continue to monitor with security cameras. Q 15 minute rounds continue. 

## 2015-10-06 NOTE — ED Notes (Signed)
Pt presents with c/o suicide attempt that occurred this morning. Pt reports that he tried to hang himself this morning. Pt reports that he is just tired of his lifestyle and that is why he made this attempt. Pt is calm and cooperative at this time.

## 2015-10-07 ENCOUNTER — Inpatient Hospital Stay (HOSPITAL_COMMUNITY)
Admission: AD | Admit: 2015-10-07 | Discharge: 2015-10-12 | DRG: 881 | Disposition: A | Discharge status: Home / Self Care | Payer: Federal, State, Local not specified - Other | Source: Intra-hospital | Attending: Psychiatry | Admitting: Psychiatry

## 2015-10-07 ENCOUNTER — Encounter (HOSPITAL_COMMUNITY): Payer: Self-pay | Admitting: *Deleted

## 2015-10-07 DIAGNOSIS — F1120 Opioid dependence, uncomplicated: Secondary | ICD-10-CM | POA: Diagnosis present

## 2015-10-07 DIAGNOSIS — T71162A Asphyxiation due to hanging, intentional self-harm, initial encounter: Secondary | ICD-10-CM

## 2015-10-07 DIAGNOSIS — G4700 Insomnia, unspecified: Secondary | ICD-10-CM | POA: Diagnosis present

## 2015-10-07 DIAGNOSIS — I10 Essential (primary) hypertension: Secondary | ICD-10-CM | POA: Diagnosis present

## 2015-10-07 DIAGNOSIS — F1020 Alcohol dependence, uncomplicated: Secondary | ICD-10-CM | POA: Diagnosis present

## 2015-10-07 DIAGNOSIS — T1491 Suicide attempt: Secondary | ICD-10-CM

## 2015-10-07 DIAGNOSIS — E785 Hyperlipidemia, unspecified: Secondary | ICD-10-CM | POA: Diagnosis present

## 2015-10-07 DIAGNOSIS — Z818 Family history of other mental and behavioral disorders: Secondary | ICD-10-CM

## 2015-10-07 DIAGNOSIS — R45851 Suicidal ideations: Secondary | ICD-10-CM | POA: Diagnosis present

## 2015-10-07 DIAGNOSIS — F329 Major depressive disorder, single episode, unspecified: Principal | ICD-10-CM | POA: Diagnosis present

## 2015-10-07 DIAGNOSIS — Z915 Personal history of self-harm: Secondary | ICD-10-CM

## 2015-10-07 DIAGNOSIS — F1320 Sedative, hypnotic or anxiolytic dependence, uncomplicated: Secondary | ICD-10-CM | POA: Diagnosis present

## 2015-10-07 DIAGNOSIS — Z87891 Personal history of nicotine dependence: Secondary | ICD-10-CM

## 2015-10-07 DIAGNOSIS — K219 Gastro-esophageal reflux disease without esophagitis: Secondary | ICD-10-CM | POA: Diagnosis present

## 2015-10-07 DIAGNOSIS — F322 Major depressive disorder, single episode, severe without psychotic features: Secondary | ICD-10-CM | POA: Diagnosis not present

## 2015-10-07 LAB — LIPASE, BLOOD: LIPASE: 49 U/L (ref 11–51)

## 2015-10-07 LAB — AMYLASE: AMYLASE: 72 U/L (ref 28–100)

## 2015-10-07 MED ORDER — FAMOTIDINE 20 MG PO TABS
10.0000 mg | ORAL_TABLET | Freq: Every day | ORAL | Status: DC
  Administered 2015-10-08: 10 mg via ORAL
  Filled 2015-10-07 (×3): qty 0.5
  Filled 2015-10-07 (×2): qty 1
  Filled 2015-10-07: qty 0.5

## 2015-10-07 MED ORDER — PANTOPRAZOLE SODIUM 20 MG PO TBEC
20.0000 mg | DELAYED_RELEASE_TABLET | Freq: Every day | ORAL | Status: DC
  Administered 2015-10-07: 20 mg via ORAL
  Filled 2015-10-07 (×3): qty 1

## 2015-10-07 MED ORDER — LORAZEPAM 1 MG PO TABS
1.0000 mg | ORAL_TABLET | Freq: Four times a day (QID) | ORAL | Status: AC
  Administered 2015-10-07 (×4): 1 mg via ORAL
  Filled 2015-10-07 (×4): qty 1

## 2015-10-07 MED ORDER — VITAMIN B-1 100 MG PO TABS
100.0000 mg | ORAL_TABLET | Freq: Every day | ORAL | Status: DC
  Administered 2015-10-08 – 2015-10-12 (×4): 100 mg via ORAL
  Filled 2015-10-07 (×7): qty 1

## 2015-10-07 MED ORDER — SERTRALINE HCL 25 MG PO TABS
25.0000 mg | ORAL_TABLET | Freq: Every day | ORAL | Status: DC
  Filled 2015-10-07: qty 1

## 2015-10-07 MED ORDER — ONDANSETRON 4 MG PO TBDP: 4.0000 mg | ORAL_TABLET | Freq: Four times a day (QID) | ORAL | Status: AC | PRN

## 2015-10-07 MED ORDER — HYDROXYZINE HCL 25 MG PO TABS
ORAL_TABLET | ORAL | Status: AC
  Filled 2015-10-07: qty 1

## 2015-10-07 MED ORDER — MIRTAZAPINE 15 MG PO TABS
15.0000 mg | ORAL_TABLET | Freq: Every day | ORAL | Status: DC
  Administered 2015-10-07: 15 mg via ORAL
  Filled 2015-10-07 (×4): qty 1

## 2015-10-07 MED ORDER — HYDROXYZINE HCL 25 MG PO TABS
25.0000 mg | ORAL_TABLET | Freq: Four times a day (QID) | ORAL | Status: AC | PRN
  Administered 2015-10-07: 25 mg via ORAL

## 2015-10-07 MED ORDER — MAGNESIUM HYDROXIDE 400 MG/5ML PO SUSP: 30.0000 mL | Freq: Every day | ORAL | Status: DC | PRN

## 2015-10-07 MED ORDER — LORAZEPAM 1 MG PO TABS
1.0000 mg | ORAL_TABLET | Freq: Four times a day (QID) | ORAL | Status: AC | PRN
Start: 2015-10-07 — End: 2015-10-10
  Administered 2015-10-08: 1 mg via ORAL
  Filled 2015-10-07: qty 1

## 2015-10-07 MED ORDER — TRAZODONE HCL 50 MG PO TABS: 50.0000 mg | ORAL_TABLET | Freq: Every evening | ORAL | Status: DC | PRN

## 2015-10-07 MED ORDER — ACETAMINOPHEN 325 MG PO TABS
650.0000 mg | ORAL_TABLET | Freq: Four times a day (QID) | ORAL | Status: DC | PRN
  Administered 2015-10-07 – 2015-10-09 (×2): 650 mg via ORAL
  Filled 2015-10-07: qty 2

## 2015-10-07 MED ORDER — ALUM & MAG HYDROXIDE-SIMETH 200-200-20 MG/5ML PO SUSP: 30.0000 mL | ORAL | Status: DC | PRN

## 2015-10-07 MED ORDER — LORAZEPAM 1 MG PO TABS
1.0000 mg | ORAL_TABLET | Freq: Two times a day (BID) | ORAL | Status: AC
  Administered 2015-10-09 (×2): 1 mg via ORAL
  Filled 2015-10-07 (×2): qty 1

## 2015-10-07 MED ORDER — ACETAMINOPHEN 325 MG PO TABS
ORAL_TABLET | ORAL | Status: AC
  Filled 2015-10-07: qty 2

## 2015-10-07 MED ORDER — ADULT MULTIVITAMIN W/MINERALS CH
1.0000 | ORAL_TABLET | Freq: Every day | ORAL | Status: DC
  Administered 2015-10-07 – 2015-10-12 (×5): 1 via ORAL
  Filled 2015-10-07 (×8): qty 1

## 2015-10-07 MED ORDER — LORAZEPAM 1 MG PO TABS
1.0000 mg | ORAL_TABLET | Freq: Every day | ORAL | Status: AC
  Administered 2015-10-10: 1 mg via ORAL
  Filled 2015-10-07: qty 1

## 2015-10-07 MED ORDER — LOPERAMIDE HCL 2 MG PO CAPS: 2.0000 mg | ORAL_CAPSULE | ORAL | Status: AC | PRN

## 2015-10-07 MED ORDER — LORAZEPAM 1 MG PO TABS
1.0000 mg | ORAL_TABLET | Freq: Three times a day (TID) | ORAL | Status: AC
  Administered 2015-10-08 (×3): 1 mg via ORAL
  Filled 2015-10-07 (×3): qty 1

## 2015-10-07 MED ORDER — SUCRALFATE 1 G PO TABS
1.0000 g | ORAL_TABLET | Freq: Two times a day (BID) | ORAL | Status: DC
  Administered 2015-10-07 – 2015-10-08 (×2): 1 g via ORAL
  Filled 2015-10-07 (×12): qty 1

## 2015-10-07 NOTE — Tx Team (Signed)
Initial Interdisciplinary Treatment Plan   PATIENT STRESSORS: Financial difficulties Occupational concerns Substance abuse   PATIENT STRENGTHS: Ability for insight Average or above average intelligence Capable of independent living Communication skills General fund of knowledge Motivation for treatment/growth Work skills   PROBLEM LIST: Problem List/Patient Goals Date to be addressed Date deferred Reason deferred Estimated date of resolution  "mood stabilization"  10/07/15     "sobriety"  10/07/15           SI 10/07/15                                    DISCHARGE CRITERIA:  Improved stabilization in mood, thinking, and/or behavior Motivation to continue treatment in a less acute level of care Withdrawal symptoms are absent or subacute and managed without 24-hour nursing intervention  PRELIMINARY DISCHARGE PLAN: Attend 12-step recovery group Outpatient therapy  PATIENT/FAMIILY INVOLVEMENT: This treatment plan has been presented to and reviewed with the patient, Chad Knight.  The patient and family have been given the opportunity to ask questions and make suggestions.  Arrie AranChurch, Marcanthony Sleight J 10/07/2015, 3:45 AM
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
## 2015-10-07 NOTE — H&P (Signed)
Psychiatric Admission Assessment Adult  Patient Identification: Chad Knight MRN:  [id number] Date of Evaluation:  10/07/2015 Chief Complaint:  mdd,single episode Principal Diagnosis: Suicidal intent Diagnosis:   Patient Active Problem List   Diagnosis Date Noted  . Suicidal intent [R45.851] 10/07/2015  . HYPERLIPIDEMIA [E78.5] 08/02/2008  . ESSENTIAL HYPERTENSION [I10] 08/02/2008  . DENTAL PAIN [K08.9] 08/02/2008  . ACNE ROSACEA [L71.9] 08/02/2008  . CARDIAC DISEASE, HIGH RISK [Z91.89] 08/02/2008   History of Present Illnesses: Per RN Assessment Note:- Chad Knight is a 55 year old male admitted to [facility] involuntarily after suicide attempt by hanging yesterday. He reports he is here because "I tried to hang myself yesterday morning." When asked why he did this, pt stated "just life." He presents with depressed affect and mood. 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 Regarding his living arrangements, pt reports he is living with someone he thought was his friend. He reports he will only return to this residence to gather his belongings after discharge. Pt has extensive substance abuse history including consumption of alcohol; between 7 and 9 beers daily. He also reports he abuses opiates and benzos "when available." Denies medical history. Reports previous suicide attempt "about 15 years ago" when he "tried to OD." Pt denies history of physical, sexual, emotional abuse. He reports withdrawal symptom of chills and anxiety. Pt denies SI during assessment, denies HI, denies hallucinations, reports throat pain of 6/10. He reports coping skills of listening to music, reading, and talking to friends. His reported goals are "mood stabilization, sobriety."  On Evaluation:Chad Knight is awake,  alert and oriented X3 , found attending group session.  Denies suicidal or homicidal ideation at this time.  Patient dose contract for safety while on the unit. Patient dose states he has ongoing thoughts of suicidal ideations. Reports past attempts. Reports multiple stressors that are beyond "my control".  Denies auditory or visual hallucination and does not appear to be responding to internal stimuli. States his/ depression 10/10. Patient states "I am feeling sad, I just thought that the grass was greener on the other side". Reports drinking vodka and "popping xanax daily".  Reports he was inpatient in 2008.  States I though I has a handle on this current situation with my job, wife and kids.  Patient has LUQ pain that been ongoing for the past 3 weeks. Denies pain radiation. Patient reports hx of GERD. Reports this doesn't  feel the same. States he feels like he has a "hiatal hernia".  EKG 3/3/ 2017 reviewed. See treatment listed below. Support, encouragement and reassurance was provided.    Associated Signs/Symptoms: Depression Symptoms:  feelings of worthlessness/guilt, suicidal attempt, anxiety, (Hypo) Manic Symptoms:  Impulsivity, Irritable Mood, Anxiety Symptoms:  Excessive Worry, Social Anxiety, Psychotic Symptoms:  Hallucinations: None PTSD Symptoms: Avoidance:  Foreshortened Future Total Time spent with patient: 30 minutes  Past Psychiatric History: See Above  Is the patient at risk to self? Yes.    Has the patient been a risk to self in the past 6 months? Yes.    Has the patient been a risk to self within the distant past? No.  Is the patient a risk to others? No.  Has the patient been a risk to others in the past 6 months? No.  Has the patient been a risk to others  within the distant past? Yes.     Prior Inpatient Therapy:   Prior Outpatient Therapy:    Alcohol Screening: 1. How often do you have a drink containing alcohol?: 2 to 3 times a week 2. How many drinks containing  alcohol do you have on a typical day when you are drinking?: 7, 8, or 9 3. How often do you have six or more drinks on one occasion?: Daily or almost daily Preliminary Score: 7 4. How often during the last year have you found that you were not able to stop drinking once you had started?: Less than monthly 5. How often during the last year have you failed to do what was normally expected from you becasue of drinking?: Weekly 6. How often during the last year have you needed a first drink in the morning to get yourself going after a heavy drinking session?: Weekly 8. How often during the last year have you been unable to remember what happened the night before because you had been drinking?: Never 9. Have you or someone else been injured as a result of your drinking?: No 10. Has a relative or friend or a doctor or another health worker been concerned about your drinking or suggested you cut down?: No Alcohol Use Disorder Identification Test Final Score (AUDIT): 17 Brief Intervention: Yes Substance Abuse History in the last 12 months:  Yes.   Consequences of Substance Abuse: Withdrawal Symptoms:   Diarrhea Headaches Vomiting Previous Psychotropic Medications: NO Psychological Evaluations: NO Past Medical History: History reviewed. No pertinent past medical history.  Past Surgical History  Procedure Laterality Date  . Foot surgery     Family History: History reviewed. No pertinent family history. Family Psychiatric  History:Father: Depression Tobacco Screening: @FLOW [phone])::1)@ Social History:  History  Alcohol Use  . 21.0 oz/week  . 35 Cans of beer per week    Comment: daily      History  Drug Use No    Additional Social History: Marital status: Divorced Divorced, when?: 2007 What types of issues is patient dealing with in the relationship?: Patuient did not disclose Additional relationship information: NA Are you sexually active?:  (Pt did not disclose) What is your  sexual orientation?: heterosexual Has your sexual activity been affected by drugs, alcohol, medication, or emotional stress?: Pt did not disclose Does patient have children?: Yes How many children?: 2 How is patient's relationship with their children?: No contact6 with 28 YO son; strained with 44 YO daughter (pt planned suicide while she was out of the country)    Pain Medications: pt reports using opiates when he can Prescriptions: pt reports using benzodiazepines when he can Over the Counter: denies History of alcohol / drug use?: Yes Longest period of sobriety (when/how long): 1 year Negative Consequences of Use: Financial, Personal relationships, Work / Youth worker Withdrawal Symptoms: Sweats, Other (Comment) (anxiety) Name of Substance 1: Alcohol 1 - Age of First Use: 16 1 - Amount (size/oz): 7-9 beers 1 - Frequency: most days 1 - Duration: ongoing Name of Substance 2: Opiods 2 - Age of First Use: 40s 2 - Amount (size/oz): varies 2 - Frequency: "when available"  2 - Duration: ongoing 2 - Last Use / Amount: Tuesday per pt Name of Substance 3: Benzo 3 - Age of First Use: 20s 3 - Amount (size/oz): varies 3 - Frequency: "when available"  3 - Duration: on-going              Allergies:   Allergies  Allergen Reactions  .  Bee Venom Anaphylaxis    Per pt  . Darvon [Propoxyphene] Hives and Itching   Lab Results:  Results for orders placed or performed during the hospital encounter of 10/06/15 (from the past 48 hour(s))  Urine rapid drug screen (hosp performed) (Not at [facility])     Status: Abnormal   Collection Time: 10/06/15  4:02 PM  Result Value Ref Range   Opiates NONE DETECTED NONE DETECTED   Cocaine NONE DETECTED NONE DETECTED   Benzodiazepines POSITIVE (A) NONE DETECTED   Amphetamines NONE DETECTED NONE DETECTED   Tetrahydrocannabinol NONE DETECTED NONE DETECTED   Barbiturates NONE DETECTED NONE DETECTED    Comment:        DRUG SCREEN FOR MEDICAL PURPOSES ONLY.  IF  CONFIRMATION IS NEEDED FOR ANY PURPOSE, NOTIFY LAB WITHIN 5 DAYS.        LOWEST DETECTABLE LIMITS FOR URINE DRUG SCREEN Drug Class       Cutoff (ng/mL) Amphetamine      1000 Barbiturate      200 Benzodiazepine   458 Tricyclics       592 Opiates          300 Cocaine          300 THC              50   Comprehensive metabolic panel     Status: None   Collection Time: 10/06/15  4:05 PM  Result Value Ref Range   Sodium 143 135 - 145 mmol/L   Potassium 3.6 3.5 - 5.1 mmol/L   Chloride 107 101 - 111 mmol/L   CO2 28 22 - 32 mmol/L   Glucose, Bld 97 65 - 99 mg/dL   BUN 14 6 - 20 mg/dL   Creatinine, Ser 0.92 0.61 - 1.24 mg/dL   Calcium 9.2 8.9 - 10.3 mg/dL   Total Protein 6.5 6.5 - 8.1 g/dL   Albumin 4.1 3.5 - 5.0 g/dL   AST 33 15 - 41 U/L   ALT 35 17 - 63 U/L   Alkaline Phosphatase 62 38 - 126 U/L   Total Bilirubin 0.4 0.3 - 1.2 mg/dL   GFR calc non Af Amer >60 >60 mL/min   GFR calc Af Amer >60 >60 mL/min    Comment: (NOTE) The eGFR has been calculated using the CKD EPI equation. This calculation has not been validated in all clinical situations. eGFR's persistently <60 mL/min signify possible Chronic Kidney Disease.    Anion gap 8 5 - 15  Ethanol (ETOH)     Status: Abnormal   Collection Time: 10/06/15  4:05 PM  Result Value Ref Range   Alcohol, Ethyl (B) 6 (H) <5 mg/dL    Comment:        LOWEST DETECTABLE LIMIT FOR SERUM ALCOHOL IS 5 mg/dL FOR MEDICAL PURPOSES ONLY   Salicylate level     Status: None   Collection Time: 10/06/15  4:05 PM  Result Value Ref Range   Salicylate Lvl <9.2 2.8 - 30.0 mg/dL  Acetaminophen level     Status: Abnormal   Collection Time: 10/06/15  4:05 PM  Result Value Ref Range   Acetaminophen (Tylenol), Serum <10 (L) 10 - 30 ug/mL    Comment:        THERAPEUTIC CONCENTRATIONS VARY SIGNIFICANTLY. A RANGE OF 10-30 ug/mL MAY BE AN EFFECTIVE CONCENTRATION FOR MANY PATIENTS. HOWEVER, SOME ARE BEST TREATED AT CONCENTRATIONS OUTSIDE  THIS RANGE. ACETAMINOPHEN CONCENTRATIONS >150 ug/mL AT 4 HOURS AFTER INGESTION AND >50 ug/mL  AT 12 HOURS AFTER INGESTION ARE OFTEN ASSOCIATED WITH TOXIC REACTIONS.   CBC     Status: Abnormal   Collection Time: 10/06/15  4:05 PM  Result Value Ref Range   WBC 6.1 4.0 - 10.5 K/uL   RBC 4.42 4.22 - 5.81 MIL/uL   Hemoglobin 15.1 13.0 - 17.0 g/dL   HCT 44.4 39.0 - 52.0 %   MCV 100.5 (H) 78.0 - 100.0 fL   MCH 34.2 (H) 26.0 - 34.0 pg   MCHC 34.0 30.0 - 36.0 g/dL   RDW 13.3 11.5 - 15.5 %   Platelets 210 150 - 400 K/uL    Blood Alcohol level:  Lab Results  Component Value Date   ETH 6* 10/06/2015   [facility]  08/26/2007    <5        LOWEST DETECTABLE LIMIT FOR SERUM ALCOHOL IS 11 mg/dL FOR MEDICAL PURPOSES ONLY    Metabolic Disorder Labs:  No results found for: HGBA1C, MPG No results found for: PROLACTIN No results found for: CHOL, TRIG, HDL, CHOLHDL, VLDL, LDLCALC  Current Medications: Current Facility-Administered Medications  Medication Dose Route Frequency Provider Last Rate Last Dose  . acetaminophen (TYLENOL) tablet 650 mg  650 mg Oral Q6H PRN Jenne Campus, MD   650 mg at 10/07/15 0322  . alum & mag hydroxide-simeth (MAALOX/MYLANTA) 200-200-20 MG/5ML suspension 30 mL  30 mL Oral Q4H PRN Jenne Campus, MD      . famotidine (PEPCID) tablet 10 mg  10 mg Oral Daily Derrill Center, NP      . hydrOXYzine (ATARAX/VISTARIL) tablet 25 mg  25 mg Oral Q6H PRN Jenne Campus, MD   25 mg at 10/07/15 0322  . loperamide (IMODIUM) capsule 2-4 mg  2-4 mg Oral PRN Jenne Campus, MD      . LORazepam (ATIVAN) tablet 1 mg  1 mg Oral Q6H PRN Jenne Campus, MD      . LORazepam (ATIVAN) tablet 1 mg  1 mg Oral QID Jenne Campus, MD   1 mg at 10/07/15 1525   Followed by  . [START ON 10/08/2015] LORazepam (ATIVAN) tablet 1 mg  1 mg Oral TID Jenne Campus, MD       Followed by  . [START ON 10/09/2015] LORazepam (ATIVAN) tablet 1 mg  1 mg Oral BID Jenne Campus, MD       Followed by   . [START ON 10/10/2015] LORazepam (ATIVAN) tablet 1 mg  1 mg Oral Daily Fernando A [name], MD      . magnesium hydroxide (MILK OF MAGNESIA) suspension 30 mL  30 mL Oral Daily PRN Jenne Campus, MD      . multivitamin with minerals tablet 1 tablet  1 tablet Oral Daily Jenne Campus, MD   1 tablet at 10/07/15 0854  . ondansetron (ZOFRAN-ODT) disintegrating tablet 4 mg  4 mg Oral Q6H PRN Myer Peer [name], MD      . sucralfate (CARAFATE) tablet 1 g  1 g Oral BID Derrill Center, NP      . Derrill Memo ON 10/08/2015] thiamine (VITAMIN B-1) tablet 100 mg  100 mg Oral Daily Myer Peer [name], MD      . traZODone (DESYREL) tablet 50 mg  50 mg Oral QHS PRN Jenne Campus, MD       PTA Medications: Prescriptions prior to admission  Medication Sig Dispense Refill Last Dose  . ibuprofen (ADVIL,MOTRIN) 200 MG tablet Take 400 mg by mouth  every 6 (six) hours as needed for headache, mild pain or moderate pain.   10/04/2015  . loperamide (IMODIUM A-D) 2 MG capsule Take 2 mg by mouth as needed for diarrhea or loose stools.   10/04/2015 at Unknown time    Musculoskeletal: Strength & Muscle Tone: within normal limits Gait & Station: normal Patient leans: N/A  Psychiatric Specialty Exam: Physical Exam  Nursing note and vitals reviewed. Constitutional: He is oriented to person, place, and time. He appears well-developed.  HENT:  Head: Normocephalic.  Neck: Normal range of motion. Neck supple.  Cardiovascular: Normal rate.   Neurological: He is alert and oriented to person, place, and time.  Skin: Skin is warm and dry.  Psychiatric: He has a normal mood and affect.    Review of Systems  Gastrointestinal: Positive for abdominal pain.  Psychiatric/Behavioral: Positive for depression, suicidal ideas and substance abuse. Negative for hallucinations. The patient is nervous/anxious and has insomnia.   All other systems reviewed and are negative.   Blood pressure 149/84, pulse 68, temperature 97.6 F (36.4 C),  temperature source Oral, resp. rate 16, height 5' 10"  (1.778 m), weight 92.534 kg (204 lb).Body mass index is 29.27 kg/(m^2).  General Appearance: Guarded  Eye Contact::  Fair  Speech:  Clear and Coherent  Volume:  Normal  Mood:  Anxious and Depressed  Affect:  Depressed and Flat  Thought Process:  Coherent and Logical  Orientation:  Full (Time, Place, and Person)  Thought Content:  Hallucinations: None  Suicidal Thoughts:  Yes.  with intent/plan Patient doses contract for safety while on the unit  Homicidal Thoughts:  No  Memory:  Immediate;   Fair Recent;   Fair Remote;   Fair  Judgement:  Poor  Insight:  Present  Psychomotor Activity:  Restlessness  Concentration:  Fair  Recall:  [employer] of Knowledge:Fair  Language: Good  Akathisia:  No  Handed:  Right  AIMS (if indicated):     Assets:  Desire for Improvement Social Support  ADL's:  Intact  Cognition: WNL  Sleep:  Number of Hours: 2.25     I agree with current treatment plan on 10/07/2015, Patient seen face-to-face for psychiatric evaluation follow-up, chart reviewed and case discussed with the MD Cobose. Reviewed the information documented and agree with the treatment plan.  Treatment Plan Summary: Daily contact with patient to assess and evaluate symptoms and progress in treatment and Medication management  Discussed  Zoloft 52ms with titration for mood stabilization/depression ( patient would like to thinks about starting any mood stabilizers at this time) Orders placed for Amylase/Lipase Started on H2 blocker Pepcid 10 mg PO  daily/ Carafate BID- past hx of GERD. Continue with Trazodone 50 mg PO QHS for insomnia Continue Ativan Protocol/ CIWA Will continue to monitor vitals ,medication compliance and treatment side effects while patient is here.  Reviewed labs Glucose 97 WNL ,BAL -6 elevated, UDS - positive for benzodiazepines. CSW will start working on disposition.  Patient to participate in therapeutic  milieu  Observation Level/Precautions:  15 minute checks  Laboratory:  CBC Chemistry Profile UDS UA Orders placed for Amylase and Lipase   Psychotherapy:  Individual and group session  Medications:  Zoloft with titration patient is refusing medications at this time  Consultations: Psychiatry   Discharge Concerns:  Safety, stabilization, and risk of access to medication and medication stabilization   Estimated LOS:5-7 days  Other:     I certify that inpatient services furnished can reasonably be expected to  improve the patient's condition.    Derrill Center, NP 3/4/20175:06 PM Case discussed with NP and patient seen by me  Agree with NP note and assessment  66 year old divorced male, employed, lives with a roommate.  He has a history of polysubstance dependence, and has been drinking heavily and daily, usually 6 " [employer] Hard Lemonade" , has also been abusing opiate analgesics and benzodiazepines ( mainly xanax, of which he takes up to 4 mgrs daily ) . He reports depression, and has been having suicidal ideations over recent weeks. Yesterday he attempted to hang self , but states " it did not work" , after which he came to the hospital. Of note, UDS positive for BZDs, negative for opiates , and BAL 6  States his depression tends to worsen during periods of heavy drinking, and improve when he is sober, but does report a history of chronic depression and states he has tried several different antidepressants in the past . States " I don't think anything worked ". Remembers trying Prozac, Celexa, Zoloft, Paxil, Effexor , Wellbutrin.  Denies history of DTs . Did have a seizure in the past but unsure if it was related to ETOH WDL or related to Wellbutrin trial.  At this time not presenting with symptoms of alcohol WDL- no tremors, no diaphoresis, no restlessness, vitals stable Dx- Polysubstance Dependence ( BZDs, Alcohol ,Opiates )  Plan- Inpatient admission, agrees to REMERON trial, which  may help depression and insomnia, which he reports, ATIVAN detox protocol

## 2015-10-07 NOTE — Progress Notes (Signed)
Patient did attend the evening speaker AA meeting.  

## 2015-10-07 NOTE — ED Notes (Signed)
Pt. Noted sleeping in room. No complaints or concerns voiced. No distress or abnormal behavior noted. Will continue to monitor with security cameras. Q 15 minute rounds continue. 

## 2015-10-07 NOTE — Progress Notes (Signed)
Writer has observed patient up in the dayroom witih minimal interaction with peers. He attended AA meeting and was compliant with his scheduled medications. He voiced some concern about not getting his trazadone for sleep and is willing to try the remeron tonight. He currently reports that he has no goal that he is working towards and has no support system. Support given and safety maintained on unit with 15 min checks.

## 2015-10-07 NOTE — Progress Notes (Signed)
Chad Knight is a 55 year old male [facility] after suicide attempt by hanging yesterday.  He reports he is here because "I tried to hang myself yesterday morning."  When asked why he did this, pt stated "just life."  He presents with depressed affect and mood.  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  Regarding his living arrangements, pt reports he is living with someone he thought was his friend.  He reports he will only return to this residence to gather his belongings after discharge.  Pt has extensive substance abuse history including consumption of alcohol; between 7 and 9 beers daily.  He also reports he abuses opiates and benzos "when available."  Denies medical history.  Reports previous suicide attempt "about 15 years ago" when he "tried to OD."  Pt denies history of physical, sexual, emotional abuse.  He reports withdrawal symptom of chills and anxiety.  Pt denies SI during assessment, denies HI, denies hallucinations, reports throat pain of 6/10.  He reports coping skills of listening to music, reading, and talking to friends.  His reported goals are "mood stabilization, sobriety."    Introduced self to pt.  Admission process and paperwork completed with pt.  Non-invasive body assessment completed.  Pt has superficial abrasions to both shins, bruise to right upper thigh.  He has slight erythema to anterior neck.  Belongings searched for contraband.  Items not allowed on unit are in locker 5.  Pt oriented to unit and his room.  Encouragement and support offered.  Actively listened to pt.  PO fluids encouraged and provided.  PRN medication administered for pain and anxiety.    Pt is calm and cooperative with admission process.  He verbally contracts for safety and reports that he will inform  staff of needs and concerns.  Will continue to monitor and assess for safety.

## 2015-10-07 NOTE — [facility] Group Notes (Signed)
[facility] Group Notes: (Clinical Social Work)   10/07/2015      Type of Therapy:  Group Therapy   Participation Level:  Did Not Attend despite MHT prompting   Narek Kniss Grossman-Orr, LCSW 10/07/2015, 11:05 AM     

## 2015-10-07 NOTE — Progress Notes (Signed)
D) Pt's mood is depressed and affect flat. Eye contact is poor. Pt was able to sit through a group. Did not contribute and kept his eyes casted down. When the groups was over, Pt was asked did the information to speak to him and he said "Yes" but would not elaborate. Pt rates his depression at a 3, hopelessness at a 3 and his anxiety at a 3 and denies SI and HI. Questioned this Clinical research associatewriter "Do I have to take the antidepressant Zoloft? I don't want to take it. The only antidepressant that I will take is Trazodone. It works in Office managermy body. A) given support and reassurance. Attempts made at engaging Pt. But Pt was non committal in his response.  Encouragement given. R) Denies SI and HI.

## 2015-10-07 NOTE — [facility] Suicide Risk Assessment (Addendum)
[facility] Admission Suicide Risk Assessment   Nursing information obtained from:  Patient Demographic factors:  Male, Divorced or widowed, Caucasian, Low socioeconomic status Current Mental Status:  NA (denies SI currently, attempted suicide yesterday) Loss Factors:  Financial problems / change in socioeconomic status Historical Factors:  Prior suicide attempts, Family history of mental illness or substance abuse Risk Reduction Factors:  Employed  Total Time spent with patient: 45 minutes Principal Problem: Polysubstance Dependence, Major Depression, Recurrent  Diagnosis:   Patient Active Problem List   Diagnosis Date Noted  . HYPERLIPIDEMIA [E78.5] 08/02/2008  . ESSENTIAL HYPERTENSION [I10] 08/02/2008  . DENTAL PAIN [K08.9] 08/02/2008  . ACNE ROSACEA [L71.9] 08/02/2008  . CARDIAC DISEASE, HIGH RISK [Z91.89] 08/02/2008    Continued Clinical Symptoms:  Alcohol Use Disorder Identification Test Final Score (AUDIT): 17 The "Alcohol Use Disorders Identification Test", Guidelines for Use in Primary Care, Second Edition.  World Science writer [facility]). Score between 0-7:  no or low risk or alcohol related problems. Score between 8-15:  moderate risk of alcohol related problems. Score between 16-19:  high risk of alcohol related problems. Score 20 or above:  warrants further diagnostic evaluation for alcohol dependence and treatment.   CLINICAL FACTORS:  55 year old divorced male, employed, lives with a roommate.  He has a history of polysubstance dependence, and has been drinking heavily and daily, usually 6 " Mike's Hard Lemonade" , has also been abusing opiate analgesics and benzodiazepines ( mainly xanax, of which he takes up to 4 mgrs daily ) . He reports depression, and has been having suicidal ideations over recent weeks. Yesterday he attempted to hang self , but states " it did not work" , after which he came to the hospital. Of note, UDS positive for BZDs, negative for opiates , and  BAL 6  States his depression tends to worsen during periods of heavy drinking, and improve when he is sober, but does report a history of chronic depression and states he has tried several different antidepressants in the past . States " I don't think anything worked ". Remembers trying Prozac, Celexa, Zoloft, Paxil, Effexor , Wellbutrin.  Denies history of DTs . Did have a seizure in the past but unsure if it was related to ETOH WDL or related to Wellbutrin trial.  At this time not presenting with symptoms of alcohol WDL- no tremors, no diaphoresis, no restlessness, vitals stable Dx- Polysubstance Dependence ( BZDs, Alcohol ,Opiates )  Plan- Inpatient admission, agrees to REMERON trial, which may help depression and insomnia, which he reports, ATIVAN detox protocol        Musculoskeletal: Strength & Muscle Tone: within normal limits Gait & Station: normal Patient leans: N/A  Psychiatric Specialty Exam: ROS reports L U Q pain, which he states has been going on for several weeks - describes it as very localized, reproducible on touch, not radiating. States " I think it may be a rib " Denies Chest pain, denies SOB   Blood pressure 149/84, pulse 68, temperature 97.6 F (36.4 C), temperature source Oral, resp. rate 16, height  (1.778 m), weight 204 lb (92.534 kg).Body mass index is 29.27 kg/(m^2).  General Appearance: Fairly Groomed  Patent attorney::  Fair  Speech:  Normal Rate  Volume:  Decreased  Mood:  Depressed  Affect:  Constricted  Thought Process:  Linear  Orientation:  Other:  fully alert and attentive  Thought Content:  denies hallucinations, no delusions , not internally preoccupied   Suicidal Thoughts:  No-  at this time denies plan or intention of hurting self or of suicide and contracts for safety on the unit   Homicidal Thoughts:  No  Memory:  recent and remote grossly intact  Judgement:  Fair  Insight:  Fair  Psychomotor Activity:  Normal- at this time no significant   Tremors, no diaphoresis, no acute distress   Concentration:  Good  Recall:  Good  Fund of Knowledge:Good  Language: Good  Akathisia:  Negative  Handed:  Right  AIMS (if indicated):     Assets:  Desire for Improvement Resilience  Sleep:  Number of Hours: 2.25  Cognition: WNL  ADL's:  Intact    COGNITIVE FEATURES THAT CONTRIBUTE TO RISK:  Closed-mindedness and Loss of executive function    SUICIDE RISK:   Moderate:  Frequent suicidal ideation with limited intensity, and duration, some specificity in terms of plans, no associated intent, good self-control, limited dysphoria/symptomatology, some risk factors present, and identifiable protective factors, including available and accessible social support.  PLAN OF CARE: Patient will be admitted to inpatient psychiatric unit for stabilization and safety. Will provide and encourage milieu participation. Provide medication management and maked adjustments as needed. Will also provide medication to minimize risk of alcohol WDL.    Will follow daily.    I certify that inpatient services furnished can reasonably be expected to improve the patient's condition.   Nehemiah MassedOBOS, FERNANDO, MD 10/07/2015, 4:49 PM

## 2015-10-08 MED ORDER — TRAZODONE HCL 100 MG PO TABS
100.0000 mg | ORAL_TABLET | Freq: Every evening | ORAL | Status: DC | PRN
  Administered 2015-10-08 – 2015-10-09 (×2): 100 mg via ORAL
  Filled 2015-10-08 (×2): qty 1

## 2015-10-08 NOTE — Plan of Care (Signed)
Problem: Alteration in mood & ability to function due to Goal: STG-Patient will comply with prescribed medication regimen (Patient will comply with prescribed medication regimen)  Outcome: Progressing Patient was compliant with scheduled medications.      

## 2015-10-08 NOTE — Progress Notes (Signed)
[facility]  10/08/2015 4:18 PM Chad Knight  MRN:  [id number] Subjective: Patient reports " I am feeling okay"  Objective: Chad Knight is awake, alert and oriented X4 , found resting in bedroom. Patient is flat, sad, and depressed affect. Patient is responding to questions with yes or no with head nods, Per staff notes, patient is isolative to room most of the day. Patient is not interactive with treatment plan. Denies suicidal or homicidal ideation. Denies auditory or visual hallucination and does not appear to be responding to internal stimuli. Patient reports depression 3/10. Patient states "Iam feeling okay." Per nursing staff patient is requesting to be discontinued from his night time medication (Remeron). Reports poor appetite and states he is not resting well. Support, encouragement and reassurance was provided.   Principal Problem: Suicidal intent Diagnosis:   Patient Active Problem List   Diagnosis Date Noted  . Suicidal intent [R45.851] 10/07/2015  . HYPERLIPIDEMIA [E78.5] 08/02/2008  . ESSENTIAL HYPERTENSION [I10] 08/02/2008  . DENTAL PAIN [K08.9] 08/02/2008  . ACNE ROSACEA [L71.9] 08/02/2008  . CARDIAC DISEASE, HIGH RISK [Z91.89] 08/02/2008   Total Time spent with patient: 30 minutes  Past Psychiatric History: See above Past Medical History: History reviewed. No pertinent past medical history.  Past Surgical History  Procedure Laterality Date  . Foot surgery     Family History: History reviewed. No pertinent family history. Family Psychiatric  History: unknown Social History:  History  Alcohol Use  . 21.0 oz/week  . 35 Cans of beer per week    Comment: daily      History  Drug Use No    Social History   Social History  . Marital Status: Divorced    Spouse Name: N/A  . Number of Children: N/A  . Years of Education: N/A   Social History Main Topics  . Smoking status: Former Games developermoker  . Smokeless tobacco: None  . Alcohol Use: 21.0 oz/week     35 Cans of beer per week     Comment: daily   . Drug Use: No  . Sexual Activity: Not Currently   Other Topics Concern  . None   Social History Narrative   Additional Social History:    Pain Medications: pt reports using opiates when he can Prescriptions: pt reports using benzodiazepines when he can Over the Counter: denies History of alcohol / drug use?: Yes Longest period of sobriety (when/how long): 1 year Negative Consequences of Use: Financial, Personal relationships, Work / Programmer, multimediachool Withdrawal Symptoms: Sweats, Other (Comment) (anxiety) Name of Substance 1: Alcohol 1 - Age of First Use: 16 1 - Amount (size/oz): 7-9 beers 1 - Frequency: most days 1 - Duration: ongoing Name of Substance 2: Opiods 2 - Age of First Use: 40s 2 - Amount (size/oz): varies 2 - Frequency: "when available"  2 - Duration: ongoing 2 - Last Use / Amount: Tuesday per pt Name of Substance 3: Benzo 3 - Age of First Use: 20s 3 - Amount (size/oz): varies 3 - Frequency: "when available"  3 - Duration: on-going              Sleep: Poor  Appetite:  Fair  Current Medications: Current Facility-Administered Medications  Medication Dose Route Frequency Provider Last Rate Last Dose  . acetaminophen (TYLENOL) tablet 650 mg  650 mg Oral Q6H PRN Craige CottaFernando A [name], MD   650 mg at 10/07/15 0322  . alum & mag hydroxide-simeth (MAALOX/MYLANTA) 200-200-20 MG/5ML suspension 30 mL  30 mL  Oral Q4H PRN Craige Cotta, MD      . famotidine (PEPCID) tablet 10 mg  10 mg Oral Daily Oneta Rack, NP   10 mg at 10/08/15 0811  . hydrOXYzine (ATARAX/VISTARIL) tablet 25 mg  25 mg Oral Q6H PRN Craige Cotta, MD   25 mg at 10/07/15 0322  . loperamide (IMODIUM) capsule 2-4 mg  2-4 mg Oral PRN Craige Cotta, MD      . LORazepam (ATIVAN) tablet 1 mg  1 mg Oral Q6H PRN Craige Cotta, MD      . LORazepam (ATIVAN) tablet 1 mg  1 mg Oral TID Craige Cotta, MD   1 mg at 10/08/15 1211   Followed by  . [START ON  10/09/2015] LORazepam (ATIVAN) tablet 1 mg  1 mg Oral BID Craige Cotta, MD       Followed by  . [START ON 10/10/2015] LORazepam (ATIVAN) tablet 1 mg  1 mg Oral Daily Fernando A [name], MD      . magnesium hydroxide (MILK OF MAGNESIA) suspension 30 mL  30 mL Oral Daily PRN Craige Cotta, MD      . multivitamin with minerals tablet 1 tablet  1 tablet Oral Daily Craige Cotta, MD   1 tablet at 10/08/15 0810  . ondansetron (ZOFRAN-ODT) disintegrating tablet 4 mg  4 mg Oral Q6H PRN Rockey Situ [name], MD      . sucralfate (CARAFATE) tablet 1 g  1 g Oral BID Oneta Rack, NP   1 g at 10/08/15 0810  . thiamine (VITAMIN B-1) tablet 100 mg  100 mg Oral Daily Craige Cotta, MD   100 mg at 10/08/15 0810  . traZODone (DESYREL) tablet 100 mg  100 mg Oral QHS PRN Craige Cotta, MD        Lab Results:  Results for orders placed or performed during the hospital encounter of 10/07/15 (from the past 48 hour(s))  Amylase     Status: None   Collection Time: 10/07/15  8:52 PM  Result Value Ref Range   Amylase 72 28 - 100 U/L    Comment: Performed at [facility]  Lipase, blood     Status: None   Collection Time: 10/07/15  8:52 PM  Result Value Ref Range   Lipase 49 11 - 51 U/L    Comment: Performed at [facility]    Blood Alcohol level:  Lab Results  Component Value Date   ETH 6* 10/06/2015   [facility]  08/26/2007    <5        LOWEST DETECTABLE LIMIT FOR SERUM ALCOHOL IS 11 mg/dL FOR MEDICAL PURPOSES ONLY    Physical Findings: AIMS: Facial and Oral Movements Muscles of Facial Expression: None, normal Lips and Perioral Area: None, normal Jaw: None, normal Tongue: None, normal,Extremity Movements Upper (arms, wrists, hands, fingers): None, normal Lower (legs, knees, ankles, toes): None, normal, Trunk Movements Neck, shoulders, hips: None, normal, Overall Severity Severity of abnormal movements (highest score from questions above): None,  normal Incapacitation due to abnormal movements: None, normal Patient's awareness of abnormal movements (rate only patient's report): No Awareness, Dental Status Current problems with teeth and/or dentures?: No Does patient usually wear dentures?: No  CIWA:  CIWA-Ar Total: 0 COWS:  COWS Total Score: 5  Musculoskeletal: Strength & Muscle Tone: within normal limits Gait & Station: normal Patient leans: N/A  Psychiatric Specialty Exam: Review of Systems  Psychiatric/Behavioral: Positive for  depression and suicidal ideas. The patient is nervous/anxious and has insomnia.   All other systems reviewed and are negative.   Blood pressure 149/93, pulse 79, temperature 97.4 F (36.3 C), temperature source Oral, resp. rate 16, height  (1.778 m), weight 92.534 kg (204 lb).Body mass index is 29.27 kg/(m^2).  General Appearance: Guarded, flat and irritable  Eye Contact::  Minimal  Speech:  Clear and Coherent  Volume:  Decreased  Mood:  Anxious, Depressed and Hopeless  Affect:  Depressed and Flat  Thought Process:  Logical  Orientation:  Full (Time, Place, and Person)  Thought Content:  Hallucinations: None  Suicidal Thoughts:  Yes.  with intent/plan  Homicidal Thoughts:  No  Memory:  Immediate;   Fair Recent;   Fair Remote;   Fair  Judgement:  Poor  Insight:  Lacking  Psychomotor Activity:  Restlessness  Concentration:  Poor  Recall:  [employer] of Knowledge:Fair  Language: Good  Akathisia:  No  Handed:  Right  AIMS (if indicated):     Assets:  Resilience  ADL's:  Intact  Cognition: WNL  Sleep:  Number of Hours: 3.75    I agree with current treatment plan on 10/08/2015, Patient seen face-to-face for psychiatric evaluation follow-up, chart reviewed. Reviewed the information documented and agree with the treatment plan.  Treatment Plan Summary: Daily contact with patient to assess and evaluate symptoms and progress in treatment and Medication management   Discussed Zoloft  s with titration for mood stabilization/depression ( patient would like to thinks about starting any mood stabilizers at this time) Orders placed for Amylase/Lipase Results WNL Started on H2 blocker Pepcid 10 mg PO daily/ Carafate BID- past hx of GERD. Continue with Trazodone 50 mg PO QHS for insomnia Discontinued Remeron 15 mg PO QHS for insomnia  Continue Ativan Protocol/ CIWA Will continue to monitor vitals ,medication compliance and treatment side effects while patient is here.  Reviewed labs Glucose 97 WNL ,BAL -6 elevated, UDS - positive for benzodiazepines. CSW will start working on disposition.  Patient to participate in therapeutic milieu  Oneta Rack, NP 10/08/2015, 4:18 PM Agree with NP Progress Note, as above  Nehemiah Massed, MD

## 2015-10-08 NOTE — Progress Notes (Signed)
Writer spoke with patient at beginning of shift and he was informed of the change in his sleep medication. He is pleased to be getting trazadone tonight. 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 He plans to attend AA meeting tonight and currently denies si/hi/a/v hallucinations. Safety maintained with 15 min checks.

## 2015-10-08 NOTE — Plan of Care (Signed)
Problem: Alteration in mood & ability to function due to Goal: STG-Pt will be introduced to the 12-step program of recovery (Patient will be introduced to the 12-step program of recovery and disease concept of addiction)  Outcome: Progressing Patient attended AA meeting tonight.     

## 2015-10-08 NOTE — Progress Notes (Signed)
D) Pt approached this Clinical research associatewriter and asked for a 72 hour form. States "I am upset over the fact I took the Remeron and it has made me feel funny. All I want to do it to take Trazodone. I don't want any other antidepressant. I cannot take other antidepressant except the trazodone ".  This afternoon, Pt went to his room and stated "I am going to sleep. I don't want to go to group" Affect is flat and mood depressed. Pt appears profoundly depressed and is disheveled in his appearance.. Pt denies SI and HI. Rates his depression at a 2, hopelessness at a 1 and his anxiety at a 1. Affect and mood are incongruent with Pt's response of his numbers.  A) Given support and encouragement to go to the groups. Talked with Pt about his antidepressant and shared with the doctor Patients concern and anger. Provided a 1:1. Active listening. R) Pt denies SI and HI. Rating of depression, hopelessness and anxiety are incongruent with Pt's affect, mood and response.

## 2015-10-08 NOTE — [facility] Group Notes (Signed)
[facility] Group Notes: (Clinical Social Work)   10/08/2015      Type of Therapy:  Group Therapy   Participation Level:  Did Not Attend despite MHT prompting   Marguerita Stapp Grossman-Orr, LCSW 10/08/2015, 12:24 PM     

## 2015-10-09 MED ORDER — LISINOPRIL 10 MG PO TABS
10.0000 mg | ORAL_TABLET | Freq: Every day | ORAL | Status: DC
  Administered 2015-10-09 – 2015-10-12 (×4): 10 mg via ORAL
  Filled 2015-10-09 (×5): qty 1

## 2015-10-09 NOTE — Progress Notes (Signed)
Spoke with Lambert KetoAlonzo after several patients complained that his discussions about heroin (such as asking where to get it) where interfering in their treatment. Told him how even mentioning the word could be detrimental to people who are struggling to stay clean. He verbalized understanding and we shook on the promise that he would refrain from such topics throughout his stay. He was calm and cooperative. He denied SI and contracted for safety. Will continue to monitor for needs/safety.

## 2015-10-09 NOTE — Progress Notes (Signed)
D:Patient in the dayroom on approach.  Patient states he had a good day but state there was drama.  Patient states another patient started rumors about him.  Patient appears flat and depressed.  Patient states he met his goal for today  which was to get up shower, go to group and get through the day.   Patient denies SI/HI and denies AVH. A: Staff to monitor Q 15 mins for safety.  Encouragement and support offered.  Scheduled medications administered per orders.  Trazodone administered prn for sleep. R: Patient remains safe on the unit.  Patient attended group tonight.  Patient visible on the unit.  Patient taking administered medications.

## 2015-10-09 NOTE — Plan of Care (Signed)
Problem: Alteration in mood Goal: LTG-Patient reports reduction in suicidal thoughts (Patient reports reduction in suicidal thoughts and is able to verbalize a safety plan for whenever patient is feeling suicidal)  Outcome: Progressing Patient currently denies suicidal ideations and will notify staff if feeling suicidal.

## 2015-10-09 NOTE — Progress Notes (Signed)
Recreation Therapy Notes  Date: 03.06.2017 Time: 9:30am Location: [address] Group Room   Group Topic: Stress Management  Goal Area(s) Addresses:  Patient will actively participate in stress management techniques presented during session.   Behavioral Response: Did not attend.     Clinical Observations/Feedback: Patient initially attended group, however unexpectedly exploded at a peer. Patient stated with hostility "It's you man, you're the one who stresses me out, you cause me stress." Patient made statements as he stormed out of group room. Patient did not return or make additional statements.   Marykay Lexenise L Parissa Chiao, LRT/CTRS  Evgenia Merriman L 10/09/2015 3:01 PM

## 2015-10-09 NOTE — Tx Team (Signed)
Interdisciplinary Treatment Plan Update (Adult)  Date:  10/09/2015  Time Reviewed:  8:40 AM   Progress in Treatment: Attending groups: Yes Participating in groups:  Minimally  Taking medication as prescribed:  Yes. Tolerating medication:  Yes. Family/Significant othe contact made:  SPE required for this Chad Knight.  Patient understands diagnosis:  Yes. and As evidenced by:  seeking treatment for SI, depression, alcohol, opiate, and benzo abuse. Discussing patient identified problems/goals with staff:  Yes. Medical problems stabilized or resolved:  Yes. Denies suicidal/homicidal ideation: Yes. Issues/concerns per patient self-inventory:  Other:  New problem(s) identified:  It has been noted that Chad Knight is asking roommate where he can buy heroin.   Discharge Plan or Barriers: Chad Knight would like to return home and follow-up at [facility] for mental health and substance abuse services. He requested list for AA/NA in [facility]. CSW provided Chad Knight with these.   Reason for Continuation of Hospitalization: Depression Medication stabilization Withdrawal symptoms  Comments:  Chad Knight is an 55 y.o. male. Patient walked into the ED because of reported suicide attempt this morning. Patient reports attempting to hang self this morning with a nylon climbing rope. Patient reports 3 previous attempts and last 10 years ago. Patient reports recent divorce and financial problems. Patient reports daily use of alcohol, opioids, and benzo.  Estimated length of stay:  1-3 days   New goal(s): to develop effective aftercare plan.   Additional Comments:  Patient and CSW reviewed Chad Knight's identified goals and treatment plan. Patient verbalized understanding and agreed to treatment plan. CSW reviewed [facility] "Discharge Process and Patient Involvement" Form. Chad Knight verbalized understanding of information provided and signed form.    Review of initial/current patient goals per problem list:  1.  Goal(s): Patient will participate in aftercare plan  Met: Yes   Target date: at discharge  As evidenced by: Patient will participate within aftercare plan AEB aftercare provider and housing plan at discharge being identified.  3/6: Chad Knight plans to return home; Follow-up at [place].   2. Goal (s): Patient will exhibit decreased depressive symptoms and suicidal ideations.  Met: Yes.    Target date: at discharge  As evidenced by: Patient will utilize self rating of depression at 3 or below and demonstrate decreased signs of depression or be deemed stable for discharge by MD.  3/6: Chad Knight rates depression as 2. Denies SI/HI/AVH at this time.   3. Goal(s): Patient will demonstrate decreased signs of withdrawal due to substance abuse  Met:No.   Target date:at discharge   As evidenced by: Patient will produce a CIWA/COWS score of 0, have stable vitals signs, and no symptoms of withdrawal.  3/6: Chad Knight reports mild withdrawals with CIWA of 0 and COWS of 5. High BP.   Attendees: Patient:   10/09/2015 8:40 AM   Family:   10/09/2015 8:40 AM   Physician:  Larene Beach MD 10/09/2015 8:40 AM   Nursing:   Quintin Alto RN 10/09/2015 8:40 AM   Clinical Social Worker: Maxie Better, LCSW 10/09/2015 8:40 AM   Clinical Social Worker: Erasmo Downer Drinkard LCSW; Peri Maris LCSWA 10/09/2015 8:40 AM   Other:  Gerline Legacy Nurse Case Manager 10/09/2015 8:40 AM   Other:   10/09/2015 8:40 AM   Other:   10/09/2015 8:40 AM   Other:  10/09/2015 8:40 AM   Other:  10/09/2015 8:40 AM   Other:  10/09/2015 8:40 AM    10/09/2015 8:40 AM    10/09/2015 8:40 AM  10/09/2015 8:40 AM    10/09/2015 8:40 AM    Scribe for Treatment Team:   Maxie Better, LCSW 10/09/2015 8:40 AM

## 2015-10-09 NOTE — Progress Notes (Signed)
Patient did attend the evening speaker AA meeting.  

## 2015-10-09 NOTE — [facility] Group Notes (Signed)
Pt attended AA meeting.  Skarlett Sedlacek, MHT 

## 2015-10-09 NOTE — [facility] Group Notes (Signed)
[facility]   10/09/2015 11:14 AM  Participation Quality:  Appropriate   Mood/Affect:  Appropriate  Depression Rating:  2  Anxiety Rating:  4  Thoughts of Suicide:  No Will you contract for safety?   NA  Current AVH:  No  Plan for Discharge/Comments:  Pt reports that he wants to return home and needs to start looking for another apt "I have to find a new place by the end of this month." Pt's plan is to attend AA/NA groups and [facility]. Pt is hoping to d/c on Tuesday or Wednesday. He reports no withdrawals and "okay sleep."   Transportation Means: car in parking lot   Supports: pt reports that he has some family support but did not identify whom   Smart, Research scientist (physical sciences)Markham Dumlao LCSW

## 2015-10-09 NOTE — Plan of Care (Signed)
Problem: Ineffective individual coping Goal: STG: Patient will remain free from self harm Outcome: Progressing Pt denies SI and has refrained from self-harm behavior.

## 2015-10-09 NOTE — Progress Notes (Signed)
D: George denied SI/HI and AVH but appeared flat and dysphoric at med pass. On his self-inventory sheet, he rated his depression 8, hopelessness 8, and anxiety 6. He rated sleep fair, appetite fair, energy normal, and concentration good. He admitted to feelings some cravings for alcohol. He wrote that he hopes to work on staying positive today by listening.  A: Meds given as ordered. Q15 safety checks maintained. Support/encouragement offered. R: Pt remains free from harm and continues with treatment. Will continue to monitor for needs/safety.

## 2015-10-09 NOTE — Progress Notes (Signed)
[facility]  10/09/2015 2:38 PM Chad Knight  MRN:  [id number] Subjective: Per nursing, patient has high blood pressures.  Patient states he is ok.    Objective: Chad Knight is awake, alert and oriented X4.  He was found in day room and waiting for group to start.  Discussed with patient how he was feeling.  Denied any suicidal ideations.   Denies suicidal or homicidal ideation. Denies auditory or visual hallucination and does not appear to be responding to internal stimuli.  Stated that he was at one point told that he had "high blood pressures and prescribed Clonidine". Patient is reluctant to start mood stabilizers (he has tried 5) because he had adverse drug reaction - seizures.  Principal Problem: Suicidal intent Diagnosis:   Patient Active Problem List   Diagnosis Date Noted  . Suicidal intent [R45.851] 10/07/2015  . HYPERLIPIDEMIA [E78.5] 08/02/2008  . ESSENTIAL HYPERTENSION [I10] 08/02/2008  . DENTAL PAIN [K08.9] 08/02/2008  . ACNE ROSACEA [L71.9] 08/02/2008  . CARDIAC DISEASE, HIGH RISK [Z91.89] 08/02/2008   Total Time spent with patient: 30 minutes  Past Psychiatric History: See above Past Medical History: History reviewed. No pertinent past medical history.  Past Surgical History  Procedure Laterality Date  . Foot surgery     Family History: History reviewed. No pertinent family history. Family Psychiatric  History: unknown Social History:  History  Alcohol Use  . 21.0 oz/week  . 35 Cans of beer per week    Comment: daily      History  Drug Use No    Social History   Social History  . Marital Status: Divorced    Spouse Name: N/A  . Number of Children: N/A  . Years of Education: N/A   Social History Main Topics  . Smoking status: Former Games developermoker  . Smokeless tobacco: None  . Alcohol Use: 21.0 oz/week    35 Cans of beer per week     Comment: daily   . Drug Use: No  . Sexual Activity: Not Currently   Other Topics Concern  . None    Social History Narrative   Additional Social History:    Pain Medications: pt reports using opiates when he can Prescriptions: pt reports using benzodiazepines when he can Over the Counter: denies History of alcohol / drug use?: Yes Longest period of sobriety (when/how long): 1 year Negative Consequences of Use: Financial, Personal relationships, Work / Programmer, multimediachool Withdrawal Symptoms: Sweats, Other (Comment) (anxiety) Name of Substance 1: Alcohol 1 - Age of First Use: 16 1 - Amount (size/oz): 7-9 beers 1 - Frequency: most days 1 - Duration: ongoing Name of Substance 2: Opiods 2 - Age of First Use: 40s 2 - Amount (size/oz): varies 2 - Frequency: "when available"  2 - Duration: ongoing 2 - Last Use / Amount: Tuesday per pt Name of Substance 3: Benzo 3 - Age of First Use: 20s 3 - Amount (size/oz): varies 3 - Frequency: "when available"  3 - Duration: on-going              Sleep: Poor  Appetite:  Fair  Current Medications: Current Facility-Administered Medications  Medication Dose Route Frequency Provider Last Rate Last Dose  . acetaminophen (TYLENOL) tablet 650 mg  650 mg Oral Q6H PRN Craige CottaFernando A Kaegan Hettich, MD   650 mg at 10/09/15 0516  . alum & mag hydroxide-simeth (MAALOX/MYLANTA) 200-200-20 MG/5ML suspension 30 mL  30 mL Oral Q4H PRN Craige CottaFernando A Josalyn Dettmann, MD      .  famotidine (PEPCID) tablet 10 mg  10 mg Oral Daily Oneta Rack, NP   10 mg at 10/08/15 0811  . hydrOXYzine (ATARAX/VISTARIL) tablet 25 mg  25 mg Oral Q6H PRN Craige Cotta, MD   25 mg at 10/07/15 0322  . lisinopril (PRINIVIL,ZESTRIL) tablet 10 mg  10 mg Oral Daily Adonis Brook, NP      . loperamide (IMODIUM) capsule 2-4 mg  2-4 mg Oral PRN Craige Cotta, MD      . LORazepam (ATIVAN) tablet 1 mg  1 mg Oral Q6H PRN Craige Cotta, MD   1 mg at 10/08/15 2203  . LORazepam (ATIVAN) tablet 1 mg  1 mg Oral BID Craige Cotta, MD   1 mg at 10/09/15 0745   Followed by  . [START ON 10/10/2015] LORazepam (ATIVAN)  tablet 1 mg  1 mg Oral Daily Jetaun Colbath A Aime Carreras, MD      . magnesium hydroxide (MILK OF MAGNESIA) suspension 30 mL  30 mL Oral Daily PRN Craige Cotta, MD      . multivitamin with minerals tablet 1 tablet  1 tablet Oral Daily Craige Cotta, MD   1 tablet at 10/09/15 0745  . ondansetron (ZOFRAN-ODT) disintegrating tablet 4 mg  4 mg Oral Q6H PRN Rockey Situ Cristian Grieves, MD      . sucralfate (CARAFATE) tablet 1 g  1 g Oral BID Oneta Rack, NP   1 g at 10/08/15 0810  . thiamine (VITAMIN B-1) tablet 100 mg  100 mg Oral Daily Craige Cotta, MD   100 mg at 10/09/15 0745  . traZODone (DESYREL) tablet 100 mg  100 mg Oral QHS PRN Craige Cotta, MD   100 mg at 10/08/15 2203    Lab Results:  Results for orders placed or performed during the hospital encounter of 10/07/15 (from the past 48 hour(s))  Amylase     Status: None   Collection Time: 10/07/15  8:52 PM  Result Value Ref Range   Amylase 72 28 - 100 U/L    Comment: Performed at [facility]  Lipase, blood     Status: None   Collection Time: 10/07/15  8:52 PM  Result Value Ref Range   Lipase 49 11 - 51 U/L    Comment: Performed at [facility]    Blood Alcohol level:  Lab Results  Component Value Date   ETH 6* 10/06/2015   [facility]  08/26/2007    <5        LOWEST DETECTABLE LIMIT FOR SERUM ALCOHOL IS 11 mg/dL FOR MEDICAL PURPOSES ONLY    Physical Findings: AIMS: Facial and Oral Movements Muscles of Facial Expression: None, normal Lips and Perioral Area: None, normal Jaw: None, normal Tongue: None, normal,Extremity Movements Upper (arms, wrists, hands, fingers): None, normal Lower (legs, knees, ankles, toes): None, normal, Trunk Movements Neck, shoulders, hips: None, normal, Overall Severity Severity of abnormal movements (highest score from questions above): None, normal Incapacitation due to abnormal movements: None, normal Patient's awareness of abnormal movements (rate only patient's  report): No Awareness, Dental Status Current problems with teeth and/or dentures?: No Does patient usually wear dentures?: No  CIWA:  CIWA-Ar Total: 2 COWS:  COWS Total Score: 5  Musculoskeletal: Strength & Muscle Tone: within normal limits Gait & Station: normal Patient leans: N/A  Psychiatric Specialty Exam: Review of Systems  Psychiatric/Behavioral: Positive for depression and suicidal ideas. The patient is nervous/anxious and has insomnia.   All other  systems reviewed and are negative.   Blood pressure 145/94, pulse 90, temperature 98.1 F (36.7 C), temperature source Oral, resp. rate 16, height  (1.778 m), weight 92.534 kg (204 lb).Body mass index is 29.27 kg/(m^2).  General Appearance: Guarded, flat and irritable  Eye Contact::  Minimal  Speech:  Clear and Coherent  Volume:  Decreased  Mood:  Anxious, Depressed and Hopeless  Affect:  Depressed and Flat  Thought Process:  Logical  Orientation:  Full (Time, Place, and Person)  Thought Content:  Hallucinations: None  Suicidal Thoughts:  Yes.  with intent/plan  Homicidal Thoughts:  No  Memory:  Immediate;   Fair Recent;   Fair Remote;   Fair  Judgement:  Poor  Insight:  Lacking  Psychomotor Activity:  Restlessness  Concentration:  Poor  Recall:  Fair  Fund of Knowledge:Fair  Language: Good  Akathisia:  No  Handed:  Right  AIMS (if indicated):     Assets:  Resilience  ADL's:  Intact  Cognition: WNL  Sleep:  Number of Hours: 4.25    Treatment Plan Summary: Daily contact with patient to assess and evaluate symptoms and progress in treatment and Medication management   Discussed Zoloft s with titration for mood stabilization/depression ( patient would like to thinks about starting any mood stabilizers at this time) Orders placed for Amylase/Lipase Results WNL Started on H2 blocker Pepcid 10 mg PO daily/ Carafate BID- past hx of GERD. Continue with Trazodone 100 mg PO QHS for insomnia Lisinopril 10 mg  daily HTN Continue Ativan Protocol/ CIWA Will continue to monitor vitals ,medication compliance and treatment side effects while patient is here.  Reviewed labs Glucose 97 WNL ,BAL -6 elevated, UDS - positive for benzodiazepines. CSW will start working on disposition.  Patient to participate in therapeutic milieu  Lindwood Qua, NP [facility] 10/09/2015, 2:38 PM Agree with NP Progress Note, as above  Nehemiah Massed, MD

## 2015-10-09 NOTE — [facility] Suicide Risk Assessment (Signed)
[facility] INPATIENT:  Family/Significant Other Suicide Prevention Education  Suicide Prevention Education:  Patient Refusal for Family/Significant Other Suicide Prevention Education: The patient Chad Knight has refused to provide written consent for family/significant other to be provided Family/Significant Other Suicide Prevention Education during admission and/or prior to discharge.  Physician notified.  SPE completed with pt, as pt refused to consent to family contact. SPI pamphlet provided to pt and pt was encouraged to share information with support network, ask questions, and talk about any concerns relating to SPE. Pt denies access to guns/firearms and verbalized understanding of information provided. Mobile Crisis information also provided to pt.   Smart, Deondrea Markos LCSW 10/09/2015, 4:00 PM

## 2015-10-10 MED ORDER — TRAZODONE HCL 50 MG PO TABS
50.0000 mg | ORAL_TABLET | Freq: Every evening | ORAL | Status: DC | PRN
  Administered 2015-10-11: 50 mg via ORAL
  Filled 2015-10-10: qty 1
  Filled 2015-10-10: qty 7

## 2015-10-10 MED ORDER — MIRTAZAPINE 15 MG PO TABS
7.5000 mg | ORAL_TABLET | Freq: Every day | ORAL | Status: DC
  Administered 2015-10-10: 7.5 mg via ORAL
  Filled 2015-10-10: qty 1
  Filled 2015-10-10 (×2): qty 0.5

## 2015-10-10 MED ORDER — HYDROXYZINE HCL 25 MG PO TABS
25.0000 mg | ORAL_TABLET | Freq: Once | ORAL | Status: AC
  Administered 2015-10-10: 25 mg via ORAL
  Filled 2015-10-10 (×2): qty 1

## 2015-10-10 NOTE — Progress Notes (Signed)
D:Patient in the hallway on approach.  Patient appears flat and depressed.  Patient states he had a better day.  Patient states he did not sleep well last night.  Patient states he was started on a new medication for sleep tonight. Patient denies SI/HI and denies AVH. A: Staff to monitor Q 15 mins for safety.  Encouragement and support offered.  Scheduled medications administered per orders. R: Patient remains safe on the unit.  Patient did not attend group tonight.  Patient visible on the unit for medications.  Patient taking administered medications.

## 2015-10-10 NOTE — Progress Notes (Signed)
Pt did not attend AA meeting this evening.  

## 2015-10-10 NOTE — Progress Notes (Signed)
Recreation Therapy Notes  Animal-Assisted Activity (AAA) Program Checklist/Progress Notes Patient Eligibility Criteria Checklist & Daily Group note for Rec Tx Intervention  Date: 03.07.2017 Time: 2:45pm Location: 400 Hall Dayroom   AAA/T Program Assumption of Risk Form signed by Patient/ or Parent Legal Guardian yes  Patient is free of allergies or sever asthma yes  Patient reports no fear of animals yes  Patient reports no history of cruelty to animals yes  Patient understands his/her participation is voluntary yes  Behavioral Response: Did not attend.    Chad Knight Chad Knight, LRT/CTRS       Chad Knight Knight 10/10/2015 3:05 PM 

## 2015-10-10 NOTE — [facility] Group Notes (Signed)
[facility] Group Notes:  (Nursing/Recovery)  Date:  10/10/2015  Time:0930 Type of Therapy:  Nurse Education  Participation Level:  Minimal  Participation Quality:  Appropriate and Attentive  Affect:  Irritable  Cognitive:  Alert, Appropriate and Oriented  Insight:  Appropriate  Engagement in Group:  Engaged and Limited  Modes of Intervention:  Discussion, Education and Support  Summary of Progress/Problems: Pt attended scheduled groups and was verbally engaged.   Sherryl MangesWesseh, Micheline Markes 10/10/2015,0930

## 2015-10-10 NOTE — Progress Notes (Signed)
D: Pt observed in milieu at brief intervals during shift. Pt is isolative to his room with minimal interactions with peers and staff. Pt was irritable earlier this shift; stated to writer "y'all don't do nothing on time, I want to be discharge from here" and requested a 72 hour discharge form, however, pt is IVC therefore his wish to signed a 72 hour form was not granted. Pt denied SI, HI, AVH and pain when assessed. Mr. [name] rated his depression 1/10, holelessness 1/10 and anxiety level 1/10 on self inventory sheet. Pt's goal for today is to be discharge. A: Emotional support, availability and ecouragement provided to pt throughout this shift. Medications administered as per [facility] with verbal education. Q 15 minutes checks remains effective for safety as ordered without outburst or self injurious behavior to report at this time.  R: Pt denies adverse drug reactions when assessed; verbalized understanding related to medication education. Pt remains safe on and off unit. POC continues.

## 2015-10-10 NOTE — Progress Notes (Signed)
Patient ID: Chad Knight, male   DOB: 03-16-1961, 55 y.o.   MRN: [id number] [facility] MD Progress Note  10/10/2015 4:16 PM JARRICK FJELD  MRN:  [id number] Subjective: Patient reports he is feeling "OK". At this time he minimizes depression, and states his mood has improved . He denies withdrawal symptoms at present . Denies medication side effects  Objective:  I have discussed case with treatment team and have met with patient. Patient presents with improved range of affect and smiles at times appropriately.  He states he did not tolerate Remeron at 15 mgrs QHS well, but is willing to try to restart it at a lower dose . Denies any withdrawal symptoms at this time , does not present with tremors or diaphoresis, does not appear to be in any acute distress . Behavior on unit calm- notes state that patient had been asking peers about where to obtain opiates - patient denies this, and states he plans to maintain sobriety. At this time he is thinking about disposition options, and states he has been considering going to an [employer] .   Principal Problem: Suicidal intent Diagnosis:   Patient Active Problem List   Diagnosis Date Noted  . Suicidal intent [R45.851] 10/07/2015  . HYPERLIPIDEMIA [E78.5] 08/02/2008  . ESSENTIAL HYPERTENSION [I10] 08/02/2008  . DENTAL PAIN [K08.9] 08/02/2008  . ACNE ROSACEA [L71.9] 08/02/2008  . CARDIAC DISEASE, HIGH RISK [Z91.89] 08/02/2008   Total Time spent with patient:  20 minutes   Past Psychiatric History: See above Past Medical History: History reviewed. No pertinent past medical history.  Past Surgical History  Procedure Laterality Date  . Foot surgery     Family History: History reviewed. No pertinent family history. Family Psychiatric  History: unknown Social History:  History  Alcohol Use  . 21.0 oz/week  . 35 Cans of beer per week    Comment: daily      History  Drug Use No    Social History   Social History  . Marital Status:  Divorced    Spouse Name: N/A  . Number of Children: N/A  . Years of Education: N/A   Social History Main Topics  . Smoking status: Former Research scientist (life sciences)  . Smokeless tobacco: None  . Alcohol Use: 21.0 oz/week    35 Cans of beer per week     Comment: daily   . Drug Use: No  . Sexual Activity: Not Currently   Other Topics Concern  . None   Social History Narrative   Additional Social History:    Pain Medications: pt reports using opiates when he can Prescriptions: pt reports using benzodiazepines when he can Over the Counter: denies History of alcohol / drug use?: Yes Longest period of sobriety (when/how long): 1 year Negative Consequences of Use: Financial, Personal relationships, Work / Youth worker Withdrawal Symptoms: Sweats, Other (Comment) (anxiety) Name of Substance 1: Alcohol 1 - Age of First Use: 16 1 - Amount (size/oz): 7-9 beers 1 - Frequency: most days 1 - Duration: ongoing Name of Substance 2: Opiods 2 - Age of First Use: 3s 2 - Amount (size/oz): varies 2 - Frequency: "when available"  2 - Duration: ongoing 2 - Last Use / Amount: Tuesday per pt Name of Substance 3: Benzo 3 - Age of First Use: 20s 3 - Amount (size/oz): varies 3 - Frequency: "when available"  3 - Duration: on-going  Sleep: improving   Appetite:   Improving   Current Medications: Current Facility-Administered Medications  Medication Dose Route  Frequency Provider Last Rate Last Dose  . acetaminophen (TYLENOL) tablet 650 mg  650 mg Oral Q6H PRN Jenne Campus, MD   650 mg at 10/09/15 0516  . alum & mag hydroxide-simeth (MAALOX/MYLANTA) 200-200-20 MG/5ML suspension 30 mL  30 mL Oral Q4H PRN Myer Peer Chayla Shands, MD      . lisinopril (PRINIVIL,ZESTRIL) tablet 10 mg  10 mg Oral Daily Kerrie Buffalo, NP   10 mg at 10/10/15 0802  . magnesium hydroxide (MILK OF MAGNESIA) suspension 30 mL  30 mL Oral Daily PRN Jenne Campus, MD      . mirtazapine (REMERON) tablet 7.5 mg  7.5 mg Oral QHS Myer Peer Bo Teicher, MD       . multivitamin with minerals tablet 1 tablet  1 tablet Oral Daily Jenne Campus, MD   1 tablet at 10/10/15 0801  . thiamine (VITAMIN B-1) tablet 100 mg  100 mg Oral Daily Jenne Campus, MD   100 mg at 10/10/15 0802  . traZODone (DESYREL) tablet 50 mg  50 mg Oral QHS PRN Jenne Campus, MD        Lab Results:  No results found for this or any previous visit (from the past 48 hour(s)).  Blood Alcohol level:  Lab Results  Component Value Date   ETH 6* 10/06/2015   [facility]  08/26/2007    <5        LOWEST DETECTABLE LIMIT FOR SERUM ALCOHOL IS 11 mg/dL FOR MEDICAL PURPOSES ONLY    Physical Findings: AIMS: Facial and Oral Movements Muscles of Facial Expression: None, normal Lips and Perioral Area: None, normal Jaw: None, normal Tongue: None, normal,Extremity Movements Upper (arms, wrists, hands, fingers): None, normal Lower (legs, knees, ankles, toes): None, normal, Trunk Movements Neck, shoulders, hips: None, normal, Overall Severity Severity of abnormal movements (highest score from questions above): None, normal Incapacitation due to abnormal movements: None, normal Patient's awareness of abnormal movements (rate only patient's report): No Awareness, Dental Status Current problems with teeth and/or dentures?: No Does patient usually wear dentures?: No  CIWA:  CIWA-Ar Total: 8 COWS:  COWS Total Score: 5  Musculoskeletal: Strength & Muscle Tone: within normal limits Gait & Station: normal Patient leans: N/A  Psychiatric Specialty Exam: Review of Systems  Psychiatric/Behavioral: Positive for depression and suicidal ideas. The patient is nervous/anxious and has insomnia.   All other systems reviewed and are negative. denies headache, denies chest pain, no SOB, states that  LUQ pain he had reported on admission has resolved at this time  Blood pressure 136/88, pulse 87, temperature 97.5 F (36.4 C), temperature source Oral, resp. rate 18, height _0  (1.778 m),  weight 204 lb (92.534 kg).Body mass index is 29.27 kg/(m^2).  General Appearance: improved grooming, presents vaguely guarded   Eye Contact::  Improved eye contact   Speech:  Normal Rate  Volume:  Normal  Mood:  Reports improved mood and at this time minimizes depression  Affect:  Vaguely dysphoric, irritable, but does smile at times appropriately   Thought Process:  Intact and Linear  Orientation:  Full (Time, Place, and Person)  Thought Content:  Denies hallucinations, no delusions , not internally preoccupied   Suicidal Thoughts:   Denies any suicidal ideations, denies any self injurious ideations, contracts for safety on the unit   Homicidal Thoughts:  No  Memory:  Recent and remote grossly intact   Judgement:  Fair  Insight:  Fair  Psychomotor Activity:   Normal   Concentration:  Good  Recall:  Good  Fund of Knowledge:Good  Language: Good  Akathisia:  No  Handed:  Right  AIMS (if indicated):     Assets:  Resilience  ADL's:  Intact  Cognition: WNL  Sleep:  Number of Hours: 5.5   Assessment - patient reports improvement , denies SI, and is focusing more on discharge planning. At this time not presenting with any symptoms of alcohol WDL .  He remains vaguely irritable and dysphoric, but his affect is improved and more reactive, compared to admission. Chart notes indicate patient had been asking about where to obtain opiates , but he denies this . At present denies any thoughts of relapsing .  I have encouraged patient to consider going to an [facility] or similar for added support regarding sobriety efforts . Patient agreeing to restart REMERON at lower initial dose . We have discussed side effects.   Treatment Plan Summary: Daily contact with patient to assess and evaluate symptoms and progress in treatment and Medication management   Pepcid 10 mg PO daily/ Carafate BID- past hx of GERD. Start Remeron 7.5 mgrs QHS  Lisinopril 10 mg daily HTN Treatment team working on  disposition planning  Patient to continue  Participation  in therapeutic milieu  Neita Garnet, MD   10/10/2015, 4:16 PM

## 2015-10-10 NOTE — [facility] Group Notes (Signed)
[facility] LCSW Group Therapy  10/10/2015 1:31 PM  Type of Therapy:  Group Therapy  Participation Level:  Did Not Attend-pt invited. Chose to remain in bed.  Summary of Progress/Problems: [facility] Speaker came to talk about his personal journey with substance abuse and addiction. The pt processed ways by which to relate to the speaker. [facility]HA speaker provided handouts and educational information pertaining to groups and services offered by the [facility].   Smart, Lash Matulich LCSW 10/10/2015, 1:31 PM

## 2015-10-11 MED ORDER — MIRTAZAPINE 15 MG PO TABS
15.0000 mg | ORAL_TABLET | Freq: Every day | ORAL | Status: DC
  Administered 2015-10-11: 15 mg via ORAL
  Filled 2015-10-11: qty 7
  Filled 2015-10-11 (×2): qty 1

## 2015-10-11 NOTE — Progress Notes (Signed)
Patient ID: Chad Knight, male   DOB: [birth year], 55 y.o.   MRN: [id number] [facility] MD Progress Note  10/11/2015 4:05 PM Chad Knight  MRN:  [id number] Subjective:  Patient reports improving mood and feels less depressed . Today much more verbal and less guarded than he had presented initially. Spoke at more length about stressors to include difficulty finding a job appropriate to his experience, and distancing from his adult daughter and family. He denies any ongoing or persistent suicidal ideations. At this time does not endorse any medication side effects.   Objective:  I have discussed case with treatment team and have met with patient. As above, today presenting with improved mood , and better able to discuss his recent stressors, issues that have contributed to depression. Denies side effects from medications and is wanting to increase Remeron further . No residual withdrawal symptoms at this time. Presents calm, comfortable. Behavior on unit calm, going to groups . Reports motivation in maintaining sobriety.    Principal Problem: Suicidal intent Diagnosis:   Patient Active Problem List   Diagnosis Date Noted  . Suicidal intent [R45.851] 10/07/2015  . HYPERLIPIDEMIA [E78.5] 08/02/2008  . ESSENTIAL HYPERTENSION [I10] 08/02/2008  . DENTAL PAIN [K08.9] 08/02/2008  . ACNE ROSACEA [L71.9] 08/02/2008  . CARDIAC DISEASE, HIGH RISK [Z91.89] 08/02/2008   Total Time spent with patient:  20 minutes   Past Psychiatric History: See above Past Medical History: History reviewed. No pertinent past medical history.  Past Surgical History  Procedure Laterality Date  . Foot surgery     Family History: History reviewed. No pertinent family history. Family Psychiatric  History: unknown Social History:  History  Alcohol Use  . 21.0 oz/week  . 35 Cans of beer per week    Comment: daily      History  Drug Use No    Social History   Social History  . Marital Status: Divorced    Spouse  Name: N/A  . Number of Children: N/A  . Years of Education: N/A   Social History Main Topics  . Smoking status: Former Research scientist (life sciences)  . Smokeless tobacco: None  . Alcohol Use: 21.0 oz/week    35 Cans of beer per week     Comment: daily   . Drug Use: No  . Sexual Activity: Not Currently   Other Topics Concern  . None   Social History Narrative   Additional Social History:    Pain Medications: pt reports using opiates when he can Prescriptions: pt reports using benzodiazepines when he can Over the Counter: denies History of alcohol / drug use?: Yes Longest period of sobriety (when/how long): 1 year Negative Consequences of Use: Financial, Personal relationships, Work / Youth worker Withdrawal Symptoms: Sweats, Other (Comment) (anxiety) Name of Substance 1: Alcohol 1 - Age of First Use: 16 1 - Amount (size/oz): 7-9 beers 1 - Frequency: most days 1 - Duration: ongoing Name of Substance 2: Opiods 2 - Age of First Use: 54s 2 - Amount (size/oz): varies 2 - Frequency: "when available"  2 - Duration: ongoing 2 - Last Use / Amount: Tuesday per pt Name of Substance 3: Benzo 3 - Age of First Use: 20s 3 - Amount (size/oz): varies 3 - Frequency: "when available"  3 - Duration: on-going  Sleep: improved compared to admission   Appetite:   Improving   Current Medications: Current Facility-Administered Medications  Medication Dose Route Frequency Provider Last Rate Last Dose  . acetaminophen (TYLENOL) tablet 650 mg  650  mg Oral Q6H PRN Jenne Campus, MD   650 mg at 10/09/15 0516  . alum & mag hydroxide-simeth (MAALOX/MYLANTA) 200-200-20 MG/5ML suspension 30 mL  30 mL Oral Q4H PRN Myer Peer [name], MD      . lisinopril (PRINIVIL,ZESTRIL) tablet 10 mg  10 mg Oral Daily Kerrie Buffalo, NP   10 mg at 10/11/15 0941  . magnesium hydroxide (MILK OF MAGNESIA) suspension 30 mL  30 mL Oral Daily PRN Jenne Campus, MD      . mirtazapine (REMERON) tablet 15 mg  15 mg Oral QHS Myer Peer [name], MD       . multivitamin with minerals tablet 1 tablet  1 tablet Oral Daily Jenne Campus, MD   1 tablet at 10/10/15 0801  . thiamine (VITAMIN B-1) tablet 100 mg  100 mg Oral Daily Jenne Campus, MD   100 mg at 10/10/15 0802  . traZODone (DESYREL) tablet 50 mg  50 mg Oral QHS PRN Jenne Campus, MD   50 mg at 10/11/15 3300    Lab Results:  No results found for this or any previous visit (from the past 69 hour(s)).  Blood Alcohol level:  Lab Results  Component Value Date   ETH 6* 10/06/2015   [facility]  08/26/2007    <5        LOWEST DETECTABLE LIMIT FOR SERUM ALCOHOL IS 11 mg/dL FOR MEDICAL PURPOSES ONLY    Physical Findings: AIMS: Facial and Oral Movements Muscles of Facial Expression: None, normal Lips and Perioral Area: None, normal Jaw: None, normal Tongue: None, normal,Extremity Movements Upper (arms, wrists, hands, fingers): None, normal Lower (legs, knees, ankles, toes): None, normal, Trunk Movements Neck, shoulders, hips: None, normal, Overall Severity Severity of abnormal movements (highest score from questions above): None, normal Incapacitation due to abnormal movements: None, normal Patient's awareness of abnormal movements (rate only patient's report): No Awareness, Dental Status Current problems with teeth and/or dentures?: No Does patient usually wear dentures?: No  CIWA:  CIWA-Ar Total: 6 COWS:  COWS Total Score: 5  Musculoskeletal: Strength & Muscle Tone: within normal limits Gait & Station: normal Patient leans: N/A  Psychiatric Specialty Exam: Review of Systems  Psychiatric/Behavioral: Positive for depression and suicidal ideas. The patient is nervous/anxious and has insomnia.   All other systems reviewed and are negative. denies headache, denies chest pain, no SOB, states that  LUQ pain he had reported on admission has resolved at this time  Blood pressure 137/88, pulse 120, temperature 97.8 F (36.6 C), temperature source Oral, resp. rate 18, height 5'  10" (1.778 m), weight 204 lb (92.534 kg).Body mass index is 29.27 kg/(m^2).  General Appearance: improved grooming, better related   Eye Contact::  Good eye contact   Speech:  Normal Rate  Volume:  Normal  Mood:  Reports improved mood and today presents with significantly improved range of affect   Affect:  Toy Cookey range of affect, brighter today  Thought Process:  Intact and Linear  Orientation:  Full (Time, Place, and Person)  Thought Content:  Denies hallucinations, no delusions , not internally preoccupied   Suicidal Thoughts:   Denies any suicidal ideations, denies any self injurious ideations, contracts for safety on the unit   Homicidal Thoughts:  No  Memory:  Recent and remote grossly intact   Judgement:  Fair  Insight:  Fair  Psychomotor Activity:   Normal   Concentration:  Good  Recall:  Good  Fund of Knowledge:Good  Language: Good  Akathisia:  No  Handed:  Right  AIMS (if indicated):     Assets:  Resilience  ADL's:  Intact  Cognition: WNL  Sleep:  Number of Hours: 4.5   Assessment - today patient presenting with further improvement , less dysphoric, less depressed, better related, with a brighter affect. Denies any SI. Starting to focus more on discharge planning . Interested in further Remeron titration- we have reviewed medication side effects to include risk of sedation/weight gain on this medication .  Treatment Plan Summary: Daily contact with patient to assess and evaluate symptoms and progress in treatment and Medication management   Pepcid 10 mg PO daily/ Carafate BID- past hx of GERD. Increase Remeron to 15  mgrs QHS - for depression Lisinopril 10 mg daily HTN Continue to encourage efforts to work on relapse prevention and sobriety Treatment team working on disposition planning  Patient to continue  Participation  in therapeutic milieu  Neita Garnet, MD   10/11/2015, 4:05 PM

## 2015-10-11 NOTE — Progress Notes (Signed)
DAR NOTE: Pt present with flat affect and depressed mood in the unit. Pt has been isolating himself and has been bed most of the time. Pt denies physical pain, took his meds as scheduled but refused his vitamins. As per self inventory, pt had a fair night sleep, good appetite, normal energy, and good concentration. Pt rate depression at 1, hopeless ness at 0, and anxiety at 0. Pt's goal for today is to "discharge Thursday by noon to return to work." Pt's safety ensured with 15 minute and environmental checks. Pt currently denies SI/HI and A/V hallucinations. Pt verbally agrees to seek staff if SI/HI or A/VH occurs and to consult with staff before acting on these thoughts. Will continue POC.

## 2015-10-11 NOTE — [facility] Group Notes (Signed)
[facility] LCSW Group Therapy  10/11/2015 1:17 PM  Type of Therapy:  Group Therapy  Participation Level:  Active  Participation Quality:  Attentive  Affect:  Appropriate  Cognitive:  Alert and Oriented  Insight:  Improving  Engagement in Therapy:  Improving  Modes of Intervention:  Confrontation, Discussion, Education, Exploration, Problem-solving, Rapport Building, Socialization and Support  Summary of Progress/Problems: Emotion Regulation: This group focused on both positive and negative emotion identification and allowed group members to process ways to identify feelings, regulate negative emotions, and find healthy ways to manage internal/external emotions. Group members were asked to reflect on a time when their reaction to an emotion led to a negative outcome and explored how alternative responses using emotion regulation would have benefited them. Group members were also asked to discuss a time when emotion regulation was utilized when a negative emotion was experienced. Chad Knight was attentive and engaged during today's processing group. He shared that he struggles with guilt and shame. 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 I think going back to work will really help things."   Smart, Aliannah Holstrom LCSW 10/11/2015, 1:17 PM

## 2015-10-11 NOTE — Progress Notes (Signed)
D: Patient in the dayroom on approach.  Patient states he had a good day today.  Writer noticed patient was smiling tonight which patient had not done days prior.  Patient states he is happy watching basketball on television.  Patient states his goal for today was to work on his discharge plan.  Patient denies SI/HI and denies AVH.   A: Staff to monitor Q 15 mins for safety.  Encouragement and support offered.  Scheduled medications administered per orders.   R: Patient remains safe on the unit.  Patient attended group tonight.  Patient visible on the unit and interacting with peers.  Patient taking administered medications.

## 2015-10-11 NOTE — Progress Notes (Signed)
Recreation Therapy Notes  Date: 03.08.2017 Time: 9:30am Location: [address] Group Room  Group Topic: Stress Management  Goal Area(s) Addresses:  Patient will actively participate in stress management techniques presented during session.   Behavioral Response: Did not attend.   Jeorgia Helming L River Ambrosio, LRT/CTRS         Sherrie Marsan L 10/11/2015 10:13 AM 

## 2015-10-12 DIAGNOSIS — F322 Major depressive disorder, single episode, severe without psychotic features: Secondary | ICD-10-CM

## 2015-10-12 DIAGNOSIS — F329 Major depressive disorder, single episode, unspecified: Secondary | ICD-10-CM | POA: Diagnosis present

## 2015-10-12 MED ORDER — LISINOPRIL 20 MG PO TABS
20.0000 mg | ORAL_TABLET | Freq: Every day | ORAL | Status: DC
Start: 2015-10-13 — End: 2015-10-12
  Filled 2015-10-12: qty 1
  Filled 2015-10-12: qty 7

## 2015-10-12 MED ORDER — LISINOPRIL 20 MG PO TABS: 20.0000 mg | ORAL_TABLET | Freq: Every day | ORAL | Status: AC

## 2015-10-12 MED ORDER — TRAZODONE HCL 50 MG PO TABS: 50.0000 mg | ORAL_TABLET | Freq: Every evening | ORAL | Status: AC | PRN

## 2015-10-12 MED ORDER — LISINOPRIL 20 MG PO TABS
20.0000 mg | ORAL_TABLET | Freq: Once | ORAL | Status: AC
  Administered 2015-10-12: 20 mg via ORAL
  Filled 2015-10-12: qty 1

## 2015-10-12 MED ORDER — MIRTAZAPINE 15 MG PO TABS: 15.0000 mg | ORAL_TABLET | Freq: Every day | ORAL | Status: AC

## 2015-10-12 NOTE — Tx Team (Signed)
Interdisciplinary Treatment Plan Update (Adult)  Date:  10/12/2015  Time Reviewed:  10:29 AM   Progress in Treatment: Attending groups: Yes Participating in groups:  Yes Taking medication as prescribed:  Yes. Tolerating medication:  Yes. Family/Significant othe contact made:  SPE completed with pt, as he declined family contact.  Patient understands diagnosis:  Yes. and As evidenced by:  seeking treatment for SI, depression, alcohol, opiate, and benzo abuse. Discussing patient identified problems/goals with staff:  Yes. Medical problems stabilized or resolved:  Yes. Denies suicidal/homicidal ideation: Yes. Issues/concerns per patient self-inventory:  Other:  Discharge Plan or Barriers: Pt will return home and follow-up at [facility] for mental health and substance abuse services. He requested list for AA/NA in [facility]. CSW provided pt with these.   Reason for Continuation of Hospitalization: none  Comments:  Chad Knight is an 55 y.o. male. Patient walked into the ED because of reported suicide attempt this morning. Patient reports attempting to hang self this morning with a nylon climbing rope. Patient reports 3 previous attempts and last 10 years ago. Patient reports recent divorce and financial problems. Patient reports daily use of alcohol, opioids, and benzo.  Estimated length of stay:  D/c today   Additional Comments:  Patient and CSW reviewed pt's identified goals and treatment plan. Patient verbalized understanding and agreed to treatment plan. CSW reviewed [facility] "Discharge Process and Patient Involvement" Form. Pt verbalized understanding of information provided and signed form.    Review of initial/current patient goals per problem list:  1. Goal(s): Patient will participate in aftercare plan  Met: Yes   Target date: at discharge  As evidenced by: Patient will participate within aftercare plan AEB aftercare provider and housing  plan at discharge being identified.  3/6: Pt plans to return home; Follow-up at [facility].   2. Goal (s): Patient will exhibit decreased depressive symptoms and suicidal ideations.  Met: Yes.    Target date: at discharge  As evidenced by: Patient will utilize self rating of depression at 3 or below and demonstrate decreased signs of depression or be deemed stable for discharge by MD.  3/6: Pt rates depression as 2. Denies SI/HI/AVH at this time.   3. Goal(s): Patient will demonstrate decreased signs of withdrawal due to substance abuse  Met:Yes  Target date:at discharge   As evidenced by: Patient will produce a CIWA/COWS score of 0, have stable vitals signs, and no symptoms of withdrawal.  3/6: Pt reports mild withdrawals with CIWA of 0 and COWS of 5. High BP.   3/9: Pt denies withdrawals today with CIWA score of 0/no COWS score. High BP. Per MD, pt is medically stable for discharge today. All other vitals are stable.   Attendees: Patient:   10/12/2015 10:29 AM   Family:   10/12/2015 10:29 AM   Physician:  Dr. Sabra Heck MD 10/12/2015 10:29 AM   Nursing:   Donnella Sham RN  10/12/2015 10:29 AM   Clinical Social Worker: Maxie Better, LCSW 10/12/2015 10:29 AM   Clinical Social Worker: Edwyna Shell LCSW  10/12/2015 10:29 AM   Other:  Gerline Legacy Nurse Case Manager 10/12/2015 10:29 AM   Other:  Agustina Caroli, NP 10/12/2015 10:29 AM   Other:   10/12/2015 10:29 AM   Other:  10/12/2015 10:29 AM   Other:  10/12/2015 10:29 AM   Other:  10/12/2015 10:29 AM    10/12/2015 10:29 AM    10/12/2015 10:29 AM  10/12/2015 10:29 AM    10/12/2015 10:29 AM    Scribe for Treatment Team:   Maxie Better, LCSW 10/12/2015 10:29 AM

## 2015-10-12 NOTE — Progress Notes (Signed)
  [facility] :  Will you be returning to the same living situation after discharge:  Yes,  home At discharge, do you have transportation home?: Yes,  car in parking lot Do you have the ability to pay for your medications: Yes,  mental health  Release of information consent forms completed and submitted to medical records by CSW.  Patient to Follow up at: Follow-up Information    Follow up with [facility].   Why:  Walk in between 8am-12pm Monday through Friday for hospital follow-up/assessment for mental health and substance abuse services.    Contact information:   [address] [address] , [place] [address] Phone: [phone] Fax: [phone]      Next level of care provider has access to [facility] Link:no  Safety Planning and Suicide Prevention discussed: Yes,  SPE completed with pt, as he declined to consent to family contact. SPI pamphlet and mobile crisis information provided to pt.   Have you used any form of tobacco in the last 30 days? (Cigarettes, Smokeless Tobacco, Cigars, and/or Pipes): No  Has patient been referred to the Quitline?: N/A patient is not a smoker  Patient has been referred for addiction treatment: Yes-see above. Pt also provided with NA/AA and Mental Health Association pamphlets.   Smart, Allessandra Bernardi LCSW 10/12/2015, 10:28 AM

## 2015-10-12 NOTE — Progress Notes (Signed)
Patient ID: Chad Knight, male   DOB: 01/07/1961, 55 y.o.   MRN: [id number] Adult Psychoeducational Group Note  Date:  10/12/2015 Time: 09:00  Group Topic/Focus:  Self Esteem Action Plan:   The focus of this group is to help patients create a plan to continue to build self-esteem after discharge.  Participation Level:  Did Not Attend  Participation Quality: n/a  Affect: n/a  Cognitive: n/a  Insight: n/a  Engagement in Group: n/a  Modes of Intervention:  Activity, Discussion, Education and Support  Additional Comments:  Pt consulting with MD.   Chad Knight, Chad Knight 10/12/2015, 9:35 AM

## 2015-10-12 NOTE — Progress Notes (Signed)
Patient ID: Chad Knight, male   DOB: 09/23/1960, 55 y.o.   MRN: [id number]  Pt discharged home in his personal vehicle. Pt was stable and appreciative at that time. All papers and prescriptions were given and valuables returned. Verbal understanding expressed. Denies SI/HI and A/VH. Pt given opportunity to express concerns and ask questions.

## 2015-10-12 NOTE — [facility] Suicide Risk Assessment (Signed)
[facility]   Principal Problem: Major depressive disorder, single episode Discharge Diagnoses:  Patient Active Problem List   Diagnosis Date Noted  . Major depressive disorder, single episode [F32.9] 10/12/2015  . Suicidal intent [R45.851] 10/07/2015  . HYPERLIPIDEMIA [E78.5] 08/02/2008  . ESSENTIAL HYPERTENSION [I10] 08/02/2008  . DENTAL PAIN [K08.9] 08/02/2008  . ACNE ROSACEA [L71.9] 08/02/2008  . CARDIAC DISEASE, HIGH RISK [Z91.89] 08/02/2008    Total Time spent with patient: 20 minutes  Musculoskeletal: Strength & Muscle Tone: within normal limits Gait & Station: normal Patient leans: normal  Psychiatric Specialty Exam: Review of Systems  Constitutional: Negative.   Eyes: Negative.   Respiratory: Negative.   Cardiovascular: Negative.   Gastrointestinal: Negative.   Genitourinary: Negative.   Musculoskeletal: Positive for back pain and neck pain.  Skin: Positive for rash.  Neurological: Positive for headaches.  Endo/Heme/Allergies: Negative.   Psychiatric/Behavioral: Positive for depression and substance abuse. The patient is nervous/anxious.     Blood pressure 143/93, pulse 86, temperature 98.2 F (36.8 C), temperature source Oral, resp. rate 18, height 5\' 10"  (1.778 m), weight 92.534 kg (204 lb).Body mass index is 29.27 kg/(m^2).  General Appearance: Fairly Groomed  Patent attorneyye Contact::  Fair  Speech:  Clear and Coherent409  Volume:  Normal  Mood:  Anxious  Affect:  Appropriate  Thought Process:  Coherent and Goal Directed  Orientation:  Full (Time, Place, and Person)  Thought Content:  plans as he moves on, relapse prevention plan  Suicidal Thoughts:  No  Homicidal Thoughts:  No  Memory:  Immediate;   Fair Recent;   Fair Remote;   Fair  Judgement:  Fair  Insight:  Present  Psychomotor Activity:  Normal  Concentration:  Fair  Recall:  FiservFair  Fund of Knowledge:Fair  Language: Fair  Akathisia:  No  Handed:  Right  AIMS (if  indicated):     Assets:  Desire for Improvement Housing Social Support  Sleep:  Number of Hours: 5.5  Cognition: WNL  ADL's:  Intact  In full contact with reality. There are no active S/S of withdrawal. There are no active SI plans or intent. He states he is ready to be D/C. He has a follow up appointment tomorrow. He is committed to get his life back together. He is planning to again pursue work in [employer] as the economy has gotten better. He states he is going to leave where he is staying as the relationship with his roommate is stressful. He also has to work on getting to establish  new not using friends. He is going to work on his spirituality Mental Status Per Nursing Assessment::   On Admission:  NA (denies SI currently, attempted suicide yesterday)  Demographic Factors:  Male and Caucasian  Loss Factors: Decrease in vocational status  Historical Factors: none identified  Risk Reduction Factors:   Sense of responsibility to family and Employed  Continued Clinical Symptoms:  Depression:   Comorbid alcohol abuse/dependence Alcohol/Substance Abuse/Dependencies  Cognitive Features That Contribute To Risk:  None    Suicide Risk:  Minimal: No identifiable suicidal ideation.  Patients presenting with no risk factors but with morbid ruminations; may be classified as minimal risk based on the severity of the depressive symptoms  Follow-up Information    Follow up with [facility].   Why:  Walk in between 8am-12pm Monday through Friday for hospital follow-up/assessment for mental health and substance abuse services.    Contact information:   [address] [place], [place] [address] Phone: [phone] Fax: [phone]      Plan Of Care/Follow-up recommendations:  Activity:  as tolerated Diet:  regular Follow up as above Deleah Tison A, MD 10/12/2015, 12:23 PM

## 2015-10-12 NOTE — Progress Notes (Addendum)
Patient ID: Chad Knight, male   DOB: 05/30/1961, 55 y.o.   MRN: [id number]  Pt currently presents with a pleasant affect and behavior. Per self inventory, pt rates depression at a 0, hopelessness 0 and anxiety 1. Pt's daily goal is "reminding self of how good it feels to be lucid, discharge" and they intend to do so by "weet with heather and dr. Dub Mikeslugo." Pt reports good sleep, a good appetite, normal energy and good concentration.   Pt provided with medications per providers orders. Pt's labs and vitals were monitored throughout the day. Pt supported emotionally and encouraged to express concerns and questions. Pt educated on medications and the implications of mixing alcohol with medications.   Pt's safety ensured with 15 minute and environmental checks. Pt currently denies SI/HI and A/V hallucinations. Pt verbally agrees to seek staff if SI/HI or A/VH occurs and to consult with staff before acting on these thoughts. Pt to be discharged today per MD. Pt writes on his survey to staff "Thanks!" Will continue POC.

## 2015-10-12 NOTE — Discharge Summary (Signed)
Physician Discharge Summary Note  Patient:  Chad Knight is an 55 y.o., male MRN:  [id number] DOB:  03/23/1961 Patient phone:  [phone] (home)  Patient address:   [address] [place] [address],  Total Time spent with patient: Greater than 30 minutes  Date of Admission:  10/07/2015  Date of Discharge: 10-12-15  Reason for Admission:  Worsening symptoms of depression  Principal Problem: Major depressive disorder, single episode  Discharge Diagnoses: Patient Active Problem List   Diagnosis Date Noted  . Major depressive disorder, single episode [F32.9] 10/12/2015  . Suicidal intent [R45.851] 10/07/2015  . HYPERLIPIDEMIA [E78.5] 08/02/2008  . ESSENTIAL HYPERTENSION [I10] 08/02/2008  . DENTAL PAIN [K08.9] 08/02/2008  . ACNE ROSACEA [L71.9] 08/02/2008  . CARDIAC DISEASE, HIGH RISK [Z91.89] 08/02/2008   Past Psychiatric History: Major depression  Past Medical History: History reviewed. No pertinent past medical history.  Past Surgical History  Procedure Laterality Date  . Foot surgery     Family History: History reviewed. No pertinent family history.  Family Psychiatric  History: See H&P  Social History:  History  Alcohol Use  . 21.0 oz/week  . 35 Cans of beer per week    Comment: daily      History  Drug Use No    Social History   Social History  . Marital Status: Divorced    Spouse Name: N/A  . Number of Children: N/A  . Years of Education: N/A   Social History Main Topics  . Smoking status: Former Games developermoker  . Smokeless tobacco: None  . Alcohol Use: 21.0 oz/week    35 Cans of beer per week     Comment: daily   . Drug Use: No  . Sexual Activity: Not Currently   Other Topics Concern  . None   Social History Narrative   Hospital Course: Chad Knight is a 55 year old [facility] suicide attempt by hanging yesterday. He reports he is here because "I tried to hang myself yesterday morning." When asked why he did this, pt  stated "just life." He presents with depressed affect and mood. 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 Regarding his living arrangements, pt reports he is living with someone he thought was his friend. He reports he will only return to this residence to gather his belongings after discharge. Pt has extensive substance abuse history including consumption of alcohol; between 7 and 9 beers daily. He also reports he abuses opiates and benzos "when available." Denies medical history. Reports previous suicide attempt "about 15 years ago" when he "tried to OD." Pt denies history of physical, sexual, emotional abuse. He reports withdrawal symptom of chills and anxiety. Pt denies SI during assessment, denies HI, denies hallucinations, reports throat pain of 6/10. He reports coping skills of listening to music, reading, and talking to friends. His reported goals are "mood stabilization, sobriety."  Chad Knight was admitted to the hospital with his UDS test reports showing positive Benzodiazepine. His BAL was 6 per toxicology tests results. He admits having been drinking a lot & also abusing other substances. He was admitted to the hospital with reports that he had just attempted suicide by hanging. He was here for alcohol/drug detox & mood stabilization treatments. He was presenting with substance withdrawal symptoms. His detoxification treatment was achieved using Ativan  detox regimen on a tapering dose format. He was enrolled in the group counseling sessions, AA/NA meetings being offered & held on this unit. He participated & learned coping skills. He tolerated his treatment regimen without any significant adverse effects and or reactions.  Besides the detoxification treatments, Terel was also medicated &  discharged on; Mirtazapine 15 mg for depression/insomnia & Trazodone 50 mg for insomnia. Hines has completed detox treatment and his mood is stabilized. This is evidenced by his reports of improved mood & absence of substance withdrawal symptoms. He will resume psychiatric care and routine medication management at the [facility]. He was encouraged to join/attend AA/NA meetings being offered and held within his community to achieve & maintain maximum sobriety.   Upon discharge, Ildefonso adamantly denies any suicidal, homicidal ideations, auditory, visual hallucinations, delusional thoughts, paranoia & or substance withdrawal symptoms. He left [facility] with all personal belongings in no apparent distress. He received a 7 days worth supply samples of his [facility] discharge medications provided by [facility] pharmacy. Transportation per self.   Physical Findings: AIMS: Facial and Oral Movements Muscles of Facial Expression: None, normal Lips and Perioral Area: None, normal Jaw: None, normal Tongue: None, normal,Extremity Movements Upper (arms, wrists, hands, fingers): None, normal Lower (legs, knees, ankles, toes): None, normal, Trunk Movements Neck, shoulders, hips: None, normal, Overall Severity Severity of abnormal movements (highest score from questions above): None, normal Incapacitation due to abnormal movements: None, normal Patient's awareness of abnormal movements (rate only patient's report): No Awareness, Dental Status Current problems with teeth and/or dentures?: No Does patient usually wear dentures?: No  CIWA:  CIWA-Ar Total: 6 COWS:  COWS Total Score: 5  Musculoskeletal: Strength & Muscle Tone: within normal limits Gait & Station: normal Patient leans: N/A  Psychiatric Specialty Exam: Review of Systems  Constitutional: Negative.   HENT: Negative.   Eyes: Negative.   Respiratory: Negative.   Cardiovascular: Negative.   Gastrointestinal: Negative.   Genitourinary:  Negative.   Musculoskeletal: Negative.   Skin: Negative.   Neurological: Negative.   Endo/Heme/Allergies: Negative.   Psychiatric/Behavioral: Positive for depression (Stable). Negative for suicidal ideas, hallucinations, memory loss and substance abuse. The patient has insomnia (Stable). The patient is not nervous/anxious.     Blood pressure 143/93, pulse 86, temperature 98.2 F (36.8 C), temperature source Oral, resp. rate 18, height  (1.778 m), weight 92.534 kg (204 lb).Body mass index is 29.27 kg/(m^2).  See Md;s SRA   Have you used any form of tobacco in the last 30 days? (Cigarettes, Smokeless Tobacco, Cigars, and/or Pipes): No  Has this patient used any form of tobacco in the last 30 days? (Cigarettes, Smokeless Tobacco, Cigars, and/or Pipes): No  Blood Alcohol level:  Lab Results  Component Value Date   ETH 6* 10/06/2015   [facility]  08/26/2007    <5        LOWEST DETECTABLE LIMIT FOR SERUM ALCOHOL IS 11 mg/dL FOR MEDICAL PURPOSES ONLY    Metabolic Disorder Labs:  No results found for: HGBA1C, MPG No results found for: PROLACTIN No results found for: CHOL, TRIG, HDL, CHOLHDL, VLDL, LDLCALC  See Psychiatric Specialty Exam and Suicide Risk Assessment completed by Attending Physician prior to discharge.  Discharge destination:  Home  Is patient on multiple antipsychotic therapies at discharge:  No   Has Patient had three or more failed trials of antipsychotic monotherapy by history:  No  Recommended Plan for Multiple Antipsychotic Therapies: NA    Medication List  STOP taking these medications        ibuprofen 200 MG tablet  Commonly known as:  ADVIL,MOTRIN     IMODIUM A-D 2 MG capsule  Generic drug:  loperamide      TAKE these medications      Indication   lisinopril 20 MG tablet  Commonly known as:  PRINIVIL,ZESTRIL  Take 1 tablet (20 mg total) by mouth daily. For high blood pressure  Start taking on:  10/13/2015   Indication:  High Blood Pressure      mirtazapine 15 MG tablet  Commonly known as:  REMERON  Take 1 tablet (15 mg total) by mouth at bedtime. For depression/sleep   Indication:  Trouble Sleeping, Major Depressive Disorder     traZODone 50 MG tablet  Commonly known as:  DESYREL  Take 1 tablet (50 mg total) by mouth at bedtime as needed for sleep.   Indication:  Trouble Sleeping       Follow-up Information    [facility] with [facility].   Why:  Walk in between 8am-12pm Monday through Friday for hospital follow-up/assessment for mental health and substance abuse services.    Contact information:   [address], [place] [address] Phone: [phone] Fax: [phone]     Follow-up recommendations: Activity:  As tolerated Diet: As recommended by your primary care doctor. Keep all scheduled follow-up appointments as recommended.   Comments: Take all your medications as prescribed by your mental healthcare provider. Report any adverse effects and or reactions from your medicines to your outpatient provider promptly. Patient is instructed and cautioned to not engage in alcohol and or illegal drug use while on prescription medicines. In the event of worsening symptoms, patient is instructed to call the crisis hotline, 911 and or go to the nearest ED for appropriate evaluation and treatment of symptoms. Follow-up with your primary care provider for your other medical issues, concerns and or health care needs.   Signed: Sanjuana Kava, NP, PMHNP, FNP-BC 10/12/2015, 11:56 AM  I personally assessed the patient and formulated the plan Madie Reno A. Dub Mikes, M.D.

## 2020-06-19 ENCOUNTER — Other Ambulatory Visit (HOSPITAL_COMMUNITY): Payer: Self-pay | Admitting: Physician Assistant

## 2020-06-21 MED FILL — SILDENAFIL CITRATE 20 MG TA: 20 | 10 days supply | Qty: 30 | Fill #0

## 2024-05-20 ENCOUNTER — Other Ambulatory Visit: Payer: Self-pay

## 2024-05-20 ENCOUNTER — Emergency Department (HOSPITAL_COMMUNITY)
Admission: EM | Admit: 2024-05-20 | Discharge: 2024-05-21 | Disposition: A | Discharge status: Home / Self Care | Attending: Emergency Medicine | Admitting: Emergency Medicine

## 2024-05-20 ENCOUNTER — Encounter (HOSPITAL_COMMUNITY): Payer: Self-pay

## 2024-05-20 DIAGNOSIS — Z79899 Other long term (current) drug therapy: Secondary | ICD-10-CM | POA: Diagnosis not present

## 2024-05-20 DIAGNOSIS — I10 Essential (primary) hypertension: Secondary | ICD-10-CM | POA: Insufficient documentation

## 2024-05-20 DIAGNOSIS — R519 Headache, unspecified: Secondary | ICD-10-CM | POA: Diagnosis present

## 2024-05-20 DIAGNOSIS — G4489 Other headache syndrome: Secondary | ICD-10-CM | POA: Insufficient documentation

## 2024-05-20 LAB — CBC WITH DIFFERENTIAL/PLATELET
Abs Immature Granulocytes: 0.02 K/uL (ref 0.00–0.07)
Basophils Absolute: 0.1 K/uL (ref 0.0–0.1)
Basophils Relative: 1 %
Eosinophils Absolute: 0.1 K/uL (ref 0.0–0.5)
Eosinophils Relative: 2 %
HCT: 46.3 % (ref 39.0–52.0)
Hemoglobin: 16.4 g/dL (ref 13.0–17.0)
Immature Granulocytes: 0 %
Lymphocytes Relative: 23 %
Lymphs Abs: 1.8 K/uL (ref 0.7–4.0)
MCH: 34.4 pg — ABNORMAL HIGH (ref 26.0–34.0)
MCHC: 35.4 g/dL (ref 30.0–36.0)
MCV: 97.1 fL (ref 80.0–100.0)
Monocytes Absolute: 0.7 K/uL (ref 0.1–1.0)
Monocytes Relative: 9 %
Neutro Abs: 5.1 K/uL (ref 1.7–7.7)
Neutrophils Relative %: 65 %
Platelets: 205 K/uL (ref 150–400)
RBC: 4.77 MIL/uL (ref 4.22–5.81)
RDW: 11.9 % (ref 11.5–15.5)
WBC: 7.8 K/uL (ref 4.0–10.5)
nRBC: 0 % (ref 0.0–0.2)

## 2024-05-20 MED ORDER — PROCHLORPERAZINE EDISYLATE 10 MG/2ML IJ SOLN
5.0000 mg | Freq: Once | INTRAMUSCULAR | Status: AC
  Administered 2024-05-20: 5 mg via INTRAVENOUS
  Filled 2024-05-20: qty 2

## 2024-05-20 NOTE — ED Triage Notes (Signed)
 Pt reports mild headache throughout the day today and noticed the headache to get worse accompanied by blurry vision and confusion around 2130. Pt reports headache is a 1/10 now. Pt HTN in triage. Pt equal gip strength and full strength in extremities. No numbness, tingling or loss of sensation currently. Speech is clear.

## 2024-05-21 ENCOUNTER — Encounter (HOSPITAL_COMMUNITY): Payer: Self-pay

## 2024-05-21 ENCOUNTER — Emergency Department (HOSPITAL_COMMUNITY)

## 2024-05-21 LAB — COMPREHENSIVE METABOLIC PANEL WITH GFR
ALT: 29 U/L (ref 0–44)
AST: 31 U/L (ref 15–41)
Albumin: 4.4 g/dL (ref 3.5–5.0)
Alkaline Phosphatase: 67 U/L (ref 38–126)
Anion gap: 10 (ref 5–15)
BUN: 15 mg/dL (ref 8–23)
CO2: 26 mmol/L (ref 22–32)
Calcium: 9 mg/dL (ref 8.9–10.3)
Chloride: 101 mmol/L (ref 98–111)
Creatinine, Ser: 0.89 mg/dL (ref 0.61–1.24)
GFR, Estimated: >60 mL/min (ref >60)
Glucose, Bld: 98 mg/dL (ref 70–99)
Potassium: 3.2 mmol/L — ABNORMAL LOW (ref 3.5–5.1)
Sodium: 137 mmol/L (ref 135–145)
Total Bilirubin: 1.2 mg/dL (ref 0.0–1.2)
Total Protein: 6.5 g/dL (ref 6.5–8.1)

## 2024-05-21 MED ORDER — IOHEXOL 350 MG/ML SOLN
75.0000 mL | Freq: Once | INTRAVENOUS | Status: AC | PRN
  Administered 2024-05-21: 75 mL via INTRAVENOUS

## 2024-05-21 NOTE — ED Provider Notes (Signed)
 [place] EMERGENCY DEPARTMENT AT [facility] Provider Note  CSN: [id number] Arrival date & time: 05/20/24 2237  Chief Complaint(s) Headache and Hypertension  HPI Chad Knight is a 63 y.o. male with a past medical history listed below including hypertension recently started on losartan, 2 weeks ago here for elevated blood pressures at home.  He reports that he has been dealing with a nagging headache for the past 6 months.  Usually fluctuating in nature.  Headache was a bit worse this evening and he checked his blood pressure and noted his systolics in the 190s prompting his visit.  He denied any acute visual changes, focal deficits, numbness or tingling.  Patient did report a prior history of possible TIA several years ago with an episode of brief confusion and blurry vision lasting less than a minute.  He was told by his PCP that this might have been a TIA.  Currently headache is mild.  Denies any recent infections.  No chest pain or shortness of breath.  No nausea or vomiting.  The history is provided by the patient.    Past Medical History History reviewed. No pertinent past medical history. Patient Active Problem List   Diagnosis Date Noted   Major depressive disorder, single episode 10/12/2015   Suicidal intent 10/07/2015   HYPERLIPIDEMIA 08/02/2008   ESSENTIAL HYPERTENSION 08/02/2008   DENTAL PAIN 08/02/2008   ACNE ROSACEA 08/02/2008   CARDIAC DISEASE, HIGH RISK 08/02/2008   Home Medication(s) Prior to Admission medications   Medication Sig Start Date End Date Taking? Authorizing Provider  lisinopril  (PRINIVIL ,ZESTRIL ) 20 MG tablet Take 1 tablet (20 mg total) by mouth daily. For high blood pressure 10/13/15   Nwoko, Mac I, NP  mirtazapine  (REMERON ) 15 MG tablet Take 1 tablet (15 mg total) by mouth at bedtime. For depression/sleep 10/12/15   Collene Mac I, NP  traZODone  (DESYREL ) 50 MG tablet Take 1 tablet (50 mg total) by mouth at bedtime as needed for sleep.  10/12/15   Collene Mac I, NP                                                                                                                                    Allergies Bee venom and Darvon [propoxyphene]  Review of Systems Review of Systems As noted in HPI  Physical Exam Vital Signs  I have reviewed the triage vital signs BP (!) 143/93   Pulse (!) 57   Temp 97.9 F (36.6 C) (Oral)   Resp 19   Ht 6' (1.829 m)   Wt 97.5 kg   SpO2 95%   BMI 29.16 kg/m   Physical Exam Vitals reviewed.  Constitutional:      General: He is not in acute distress.    Appearance: He is well-developed. He is not diaphoretic.  HENT:     Head: Normocephalic and atraumatic.     Right Ear: External ear normal.  Left Ear: External ear normal.     Nose: Nose normal.     Mouth/Throat:     Mouth: Mucous membranes are moist.  Eyes:     General: No scleral icterus.    Conjunctiva/sclera: Conjunctivae normal.  Neck:     Trachea: Phonation normal.  Cardiovascular:     Rate and Rhythm: Normal rate and regular rhythm.  Pulmonary:     Effort: Pulmonary effort is normal. No respiratory distress.     Breath sounds: No stridor.  Abdominal:     General: There is no distension.  Musculoskeletal:        General: Normal range of motion.     Cervical back: Normal range of motion.  Neurological:     Mental Status: He is alert and oriented to person, place, and time.     Cranial Nerves: Cranial nerves 2-12 are intact.     Sensory: Sensation is intact.     Motor: Motor function is intact.     Coordination: Coordination is intact.  Psychiatric:        Behavior: Behavior normal.     ED Results and Treatments Labs (all labs ordered are listed, but only abnormal results are displayed) Labs Reviewed  CBC WITH DIFFERENTIAL/PLATELET - Abnormal; Notable for the following components:      Result Value   MCH 34.4 (*)    All other components within normal limits  COMPREHENSIVE METABOLIC PANEL WITH GFR -  Abnormal; Notable for the following components:   Potassium 3.2 (*)    All other components within normal limits                                                                                                                         EKG  EKG Interpretation Date/Time:  Thursday May 20 2024 22:53:04 EDT Ventricular Rate:  70 PR Interval:  168 QRS Duration:  98 QT Interval:  415 QTC Calculation: 448 R Axis:   14  Text Interpretation: Sinus rhythm Abnormal R-wave progression, early transition Minimal ST depression, anterolateral leads Confirmed by Trine Likes [phone]) on 05/21/2024 12:55:12 AM       Radiology CT ANGIO HEAD NECK W WO CM Result Date: 05/21/2024 EXAM: CTA Head and Neck with Intravenous Contrast. CT Head without Contrast. CLINICAL HISTORY: Transient ischemic attack (TIA). Increasing headache for 1 day, blurred vision and confusion since 2130 hrs, HTN, GFR>60, ct head w/o and mr brain 12/20/04; 75 ml omni 350. TECHNIQUE: Axial CTA images of the head and neck performed with intravenous contrast. MIP reconstructed images were created and reviewed. Axial computed tomography images of the head/brain performed without intravenous contrast. Note: Per PQRS, the description of internal carotid artery percent stenosis, including 0 percent or normal exam, is based on [place] American Symptomatic Carotid Endarterectomy Trial (NASCET) criteria. Dose reduction technique was used including one or more of the following: automated exposure control, adjustment of mA and kV according to patient size, and/or iterative reconstruction. CONTRAST: With; COMPARISON: Prior  study from 12/28/2004. FINDINGS: CT HEAD: BRAIN: No acute intraparenchymal hemorrhage. No mass lesion. No CT evidence for acute territorial infarct. No midline shift or extra-axial collection. VENTRICLES: No hydrocephalus. ORBITS: The orbits are unremarkable. SINUSES AND MASTOIDS: The paranasal sinuses and mastoid air cells are clear. CTA  NECK: COMMON CAROTID ARTERIES: No significant stenosis. No dissection or occlusion. INTERNAL CAROTID ARTERIES: No stenosis by NASCET criteria. No dissection or occlusion. VERTEBRAL ARTERIES: No significant stenosis. No dissection or occlusion. CTA HEAD: ANTERIOR CEREBRAL ARTERIES: No significant stenosis. No occlusion. No aneurysm. MIDDLE CEREBRAL ARTERIES: No significant stenosis. No occlusion. No aneurysm. POSTERIOR CEREBRAL ARTERIES: Fetal type origin of the right PCA. No significant stenosis. No occlusion. No aneurysm. BASILAR ARTERY: No significant stenosis. No occlusion. No aneurysm. OTHER: Mild atherosclerotic change about the carotid siphons without stenosis. SOFT TISSUES: No acute finding. No masses or lymphadenopathy. BONES: No acute osseous abnormality. IMPRESSION: 1. Normal CTA of the head and neck. No large vessel occlusion or other emergent finding. No hemodynamically significant or correctable stenosis. 2. No other acute intracranial abnormality. Electronically signed by: Morene Hoard MD 05/21/2024 03:35 AM EDT RP Workstation: [id number]    Medications Ordered in ED Medications  prochlorperazine (COMPAZINE) injection 5 mg (5 mg Intravenous Given 05/20/24 2337)  iohexol (OMNIPAQUE) 350 MG/ML injection 75 mL (75 mLs Intravenous Contrast Given 05/21/24 0255)   Procedures Procedures  (including critical care time) Medical Decision Making / ED Course   Medical Decision Making Amount and/or Complexity of Data Reviewed Labs: ordered. Decision-making details documented in ED Course. Radiology: ordered and independent interpretation performed. Decision-making details documented in ED Course. ECG/medicine tests: ordered and independent interpretation performed. Decision-making details documented in ED Course.  Risk Prescription drug management.    Mild headache with elevated blood pressures.  Otherwise asymptomatic without focal deficits. Initial blood pressures with systolics in  the 200s over 120s.  Provided with Compazine for headache.  Basic labs obtained and no evidence of endorgan damage.  CBC without leukocytosis.  No anemia.  Metabolic panel without significant electrolyte derangements or renal sufficiency.  EKG without acute ischemic changes dysrhythmias or blocks.  CT angio of the head and neck obtained and reassuring.  Headache resolved and BP down to SBP in 140 w/o additional intervention. Educated on dietary/lifestyle changes that would assist in better management of HTN and HLD. PCP follow up recommended    Final Clinical Impression(s) / ED Diagnoses Final diagnoses:  Primary hypertension  Other headache syndrome   The patient appears reasonably screened and/or stabilized for discharge and I doubt any other medical condition or other [facility] requiring further screening, evaluation, or treatment in the ED at this time. I have discussed the findings, Dx and Tx plan with the patient/family who expressed understanding and agree(s) with the plan. Discharge instructions discussed at length. The patient/family was given strict return precautions who verbalized understanding of the instructions. No further questions at time of discharge.  Disposition: Discharge  Condition: Good  ED Discharge Orders     None        Follow Up: Rosalea Rosina SAILOR, PA [address] [place] [address] [phone]  Go today as scheduled   This chart was dictated using voice recognition software.  Despite best efforts to proofread,  errors can occur which can change the documentation meaning.    Trine Raynell Moder, MD 05/21/24 [phone]

## 2024-06-15 ENCOUNTER — Ambulatory Visit

## 2024-07-06 ENCOUNTER — Ambulatory Visit: Admitting: Internal Medicine
